# Patient Record
Sex: Female | Born: 1955 | Race: White | Hispanic: No
Health system: Midwestern US, Academic
[De-identification: ages and names within clinical notes are randomized; demographics above are authoritative.]

---

## 2017-01-09 ENCOUNTER — Encounter: Admit: 2017-01-09 | Discharge: 2017-01-09 | Payer: MEDICARE | Primary: Family

## 2017-01-09 ENCOUNTER — Ambulatory Visit: Admit: 2017-01-09 | Discharge: 2017-01-09 | Payer: MEDICARE | Primary: Family

## 2017-01-09 DIAGNOSIS — E079 Disorder of thyroid, unspecified: ICD-10-CM

## 2017-01-09 DIAGNOSIS — I10 Essential (primary) hypertension: ICD-10-CM

## 2017-01-09 DIAGNOSIS — R3129 Other microscopic hematuria: ICD-10-CM

## 2017-01-09 DIAGNOSIS — M1990 Unspecified osteoarthritis, unspecified site: ICD-10-CM

## 2017-01-09 DIAGNOSIS — E119 Type 2 diabetes mellitus without complications: ICD-10-CM

## 2017-01-09 DIAGNOSIS — F419 Anxiety disorder, unspecified: Principal | ICD-10-CM

## 2017-01-09 DIAGNOSIS — R339 Retention of urine, unspecified: Principal | ICD-10-CM

## 2017-01-09 DIAGNOSIS — N3941 Urge incontinence: ICD-10-CM

## 2017-01-09 NOTE — Assessment & Plan Note
-   will hold off on initiation of anticholinergic for UUI until completion of workup of microscopic hematuria

## 2017-01-09 NOTE — Progress Notes
Date of Service: 01/09/2017     Subjective:             Cynthia Stanley is a 61 y.o. female who presents for evaluation of urinary retention.    History of Present Illness  Cynthia Stanley is a 61 year old female with history significant for poorly controlled diabetes, HTN and longstanding smoking history (1ppd since the age of 61 years of age) who presents to clinic for evaluation of incomplete bladder emptying.    Patient had RBUS performed by her PCP which demonstrated PVR of and so she was referred to Urology for further evaluation. No history of recurrent urinary tract infections, bladder stones and no hydronephrosis noted on RBUS. She has not needed to be catheterized    Currently, patient endorses some mild urge urinary incontinence as the most bothersome complaint. She does also have some stress urinary incontinence. Neither are terribly bothersome to the patient and she is not needing to wear any pads for leakage. She does endorse positional voiding and needing to valsalva void. She denies bulge or need for manual reduction in order to urinate. She denies constipation or dyspareunia.    Patient does have poorly controlled diabetes as mentioned above, but she is does not recall her most recent HgbA1c. She does though state that her sugars are consistently >300 and often are too high to register a reading on her glucometer.    Patient denies any episodes of gross hematuria but has been told in the past she has had microscopic hematuria. She does have a long history of smoking as stated above. No personal of family history of urologic malignancy.    Labs and Studies:  PVR: 14 mls  U/A:  Blood: positive  Leukocytes: negative  Nitrite: negative  Glucose: positive          Past Medical History:   Diagnosis Date   ??? Anxiety disorder    ??? Arthritis    ??? DM (diabetes mellitus) (HCC)    ??? Hypertension    ??? Thyroid disorder      Past Surgical History:   Procedure Laterality Date   ??? HX TUBAL LIGATION No family history on file.  Current Outpatient Prescriptions   Medication Sig Dispense Refill   ??? insulin aspart U-100 (NOVOLOG FLEXPEN U-100 INSULIN) 100 unit/mL injection PEN Inject  under the skin.     ??? insulin detemir(+) (LEVEMIR) 100 unit/mL Inject  under the skin.     ??? insulin glargine (LANTUS) 100 unit/mL injection Inject  under the skin.     ??? metFORMIN (GLUCOPHAGE) 1,000 mg tablet Take  by mouth.       No current facility-administered medications for this visit.      No Known Allergies  Social History     Social History   ??? Marital status: Divorced     Spouse name: N/A   ??? Number of children: N/A   ??? Years of education: N/A     Occupational History   ??? Not on file.     Social History Main Topics   ??? Smoking status: Current Every Day Smoker     Types: Cigarettes   ??? Smokeless tobacco: Never Used   ??? Alcohol use No   ??? Drug use: No   ??? Sexual activity: No     Other Topics Concern   ??? Not on file     Social History Narrative   ??? No narrative on file  Review of Systems   Constitutional: Negative for activity change, appetite change, chills, diaphoresis, fatigue, fever and unexpected weight change.   HENT: Negative for congestion, hearing loss, rhinorrhea, sinus pressure, sore throat and trouble swallowing.    Eyes: Negative for photophobia and visual disturbance.   Respiratory: Negative for apnea, cough, chest tightness, shortness of breath and wheezing.    Cardiovascular: Negative for chest pain, palpitations and leg swelling.   Gastrointestinal: Negative for abdominal distention, abdominal pain, blood in stool, constipation, diarrhea, nausea, rectal pain and vomiting.   Genitourinary: Positive for difficulty urinating, frequency and urgency. Negative for decreased urine volume, dyspareunia, dysuria, enuresis, flank pain, genital sores, hematuria, vaginal bleeding, vaginal discharge and vaginal pain.   Musculoskeletal: Negative for arthralgias, back pain, gait problem, joint swelling and neck pain. Skin: Negative for rash and wound.   Neurological: Negative for dizziness, tremors, seizures, syncope, weakness, light-headedness, numbness and headaches.   Hematological: Negative for adenopathy. Does not bruise/bleed easily.   Psychiatric/Behavioral: Negative for decreased concentration and dysphoric mood. The patient is not nervous/anxious.          Objective:         ??? insulin aspart U-100 (NOVOLOG FLEXPEN U-100 INSULIN) 100 unit/mL injection PEN Inject  under the skin.   ??? insulin detemir(+) (LEVEMIR) 100 unit/mL Inject  under the skin.   ??? insulin glargine (LANTUS) 100 unit/mL injection Inject  under the skin.   ??? metFORMIN (GLUCOPHAGE) 1,000 mg tablet Take  by mouth.     Vitals:    01/09/17 0856 01/09/17 0857   BP: (!) 149/101 (!) 160/106   Pulse: 88 79   Weight: 99.3 kg (219 lb)    Height: 170.2 cm (67)      Body mass index is 34.3 kg/m???.     Physical Exam   Constitutional: She is oriented to person, place, and time. She appears well-developed and well-nourished. No distress.   HENT:   Head: Normocephalic and atraumatic.   Right Ear: External ear normal.   Left Ear: External ear normal.   Eyes: Pupils are equal, round, and reactive to light. Conjunctivae and EOM are normal.   Neck: Normal range of motion. Neck supple. No tracheal deviation present. No thyromegaly present.   Cardiovascular: Normal rate, regular rhythm, normal heart sounds and intact distal pulses.    Pulmonary/Chest: Effort normal and breath sounds normal. No respiratory distress. She has no wheezes.   Abdominal: Soft. Bowel sounds are normal. She exhibits no distension. There is no tenderness.   Musculoskeletal: Normal range of motion. She exhibits no edema or tenderness.   Neurological: She is alert and oriented to person, place, and time.   Skin: Skin is warm and dry. She is not diaphoretic. No erythema.   Psychiatric: She has a normal mood and affect. Her behavior is normal. Judgment and thought content normal. Assessment and Plan:    Problem   Microscopic Hematuria    01/09/2017 - blood in urine on dipstick analysis; will send for microscopic urinalysis; long history of smoking     Urinary Incontinence, Urge    01/09/2017 - initial evaluation by Dr. Penelope Coop; PVR = 16mL; UA with + protein and + glucose; consistent with long history of diabetes         Microscopic hematuria  - BMP today  - CT urogram and cystoscopy first available to workup microscopic hematuria    Urinary incontinence, urge  - will hold off on initiation of anticholinergic for UUI until completion of workup  of microscopic hematuria    Patient seen and discussed with Dr. Penelope Coop, who directed plan of care.    Margot Chimes, MD  PGY-3 Urology Resident    Orders Placed This Encounter   ??? CT ABD/PELV WO/W CONTRAST   ??? URINALYSIS, MICROSCOPIC   ??? BASIC METABOLIC PANEL   ??? POC URINE DIPSTICK MANUAL READ     ATTESTATION    I personally saw and evaluated the patient and determined the plan of care.??? I personally performed the key portions of the E&M visit, discussed the case with the resident, and concur with documentation of history, physical examination, assessment, and treatment plan unless otherwise noted.          Liborio Nixon, MD    01/09/2017 12:46 PM

## 2017-01-09 NOTE — Assessment & Plan Note
-   BMP today  - CT urogram and cystoscopy first available to workup microscopic hematuria

## 2017-05-01 ENCOUNTER — Encounter: Admit: 2017-05-01 | Discharge: 2017-05-01 | Payer: MEDICARE | Primary: Family

## 2017-06-26 ENCOUNTER — Encounter: Admit: 2017-06-26 | Discharge: 2017-06-26 | Payer: MEDICARE | Primary: Family

## 2017-06-26 ENCOUNTER — Ambulatory Visit: Admit: 2017-06-26 | Discharge: 2017-06-26 | Payer: MEDICARE | Primary: Family

## 2017-06-26 DIAGNOSIS — I10 Essential (primary) hypertension: ICD-10-CM

## 2017-06-26 DIAGNOSIS — R339 Retention of urine, unspecified: Principal | ICD-10-CM

## 2017-06-26 DIAGNOSIS — R3129 Other microscopic hematuria: Principal | ICD-10-CM

## 2017-06-26 DIAGNOSIS — F419 Anxiety disorder, unspecified: Principal | ICD-10-CM

## 2017-06-26 DIAGNOSIS — E119 Type 2 diabetes mellitus without complications: ICD-10-CM

## 2017-06-26 DIAGNOSIS — M1990 Unspecified osteoarthritis, unspecified site: ICD-10-CM

## 2017-06-26 DIAGNOSIS — E079 Disorder of thyroid, unspecified: ICD-10-CM

## 2017-06-26 LAB — POC CREATININE, RAD: Lab: 2.1 mg/dL — ABNORMAL HIGH (ref 0.4–1.00)

## 2017-06-26 MED ORDER — CIPROFLOXACIN HCL 500 MG PO TAB
500 mg | ORAL_TABLET | Freq: Two times a day (BID) | ORAL | 0 refills | 10.00000 days | Status: AC
Start: 2017-06-26 — End: ?

## 2018-10-06 ENCOUNTER — Encounter: Admit: 2018-10-06 | Discharge: 2018-10-07 | Payer: MEDICARE | Primary: Family

## 2018-10-06 ENCOUNTER — Encounter: Admit: 2018-10-06 | Discharge: 2018-10-06 | Payer: MEDICARE | Primary: Family

## 2018-10-06 DIAGNOSIS — R69 Illness, unspecified: Secondary | ICD-10-CM

## 2018-10-28 ENCOUNTER — Encounter: Admit: 2018-10-28 | Discharge: 2018-10-28 | Primary: Family

## 2018-11-03 ENCOUNTER — Encounter: Admit: 2018-11-03 | Discharge: 2018-11-03 | Payer: MEDICARE | Primary: Family

## 2018-11-10 ENCOUNTER — Encounter: Admit: 2018-11-10 | Discharge: 2018-11-10 | Payer: MEDICARE | Primary: Family

## 2018-11-10 NOTE — Telephone Encounter
New Referral Intake  TO PATIENT:  Thank you for contacting us, how did  you hear about our transplant program? Neph    Do you have a support person who will be with you before, during, and after a transplant?Yes   Name and Relationship of Support Person: Daughter    Is English your primary language? Yes   If no, do you or your support person need an interpreter?     Primary Insurance Company: Medicare  Policy #: [phone]  Group ID:     Secondary Insurance Company: [place] [employer]  Policy #: [id number]  Group ID:     Would you accept a lifesaving blood transfusion? Yes  Have you had a hospitalization in the past 6 months?  Stomach issues [place] and [place]  Who is your kidney doctor?  Dr. Farris Has  Who is your primary doctor?  Darden Dates      Past Medical History:  (if answer is yes, please specify)    How tall are you?  5 7  How much do you weigh? Between 200 and 300   Calculated BMI:  39.2 (used 250 as a guess  What is the cause of your kidney disease?  Bad eating and other than that she doesn't know  Are you diabetic?  Type 2     at what age were you diagnosed (Endocrinologist)? Unsure when sees a doctor at [facility]   Are you currently on dialysis?  No    Have you been denied or listed by another transplant center? No  Are you currently working with any other transplant centers? No  Have you had a previous organ transplant? No    History of:  Heart disease?  Has something but she doesn't know  Do you have a heart doctor? They come to [facility] once a month  History of heart stents?  No getting ready to do a ultrasound on her heart   Are you on medication to keep stents open (ex. Coumadin, Plavix, Brilinta)? Something that started out in shots and now is in pill she thinks   Are you on medication to help raise your blood pressure (Midodrine)? No   Lung disease? Unsure we would have to talk to the doctors   Are you on oxygen (How many liters)? 4L   Do you wear CPAP (breathing machine at night to sleep)? No Do you use tobacco or nicotine  products or have you in the past? Previously smoked   Packs/cans per day? 2-3 for years? Since age 63  When did you quit? 2-3 weeks ago   Do you use any illegal substances or have you in the past? No  TO PATIENT: If you currently smoke, you need to quit to be considered a transplant candidate.  Liver disease?  No  History of HIV or Hepatitis? No  Cancer? No  Any active infections? No    open wounds or sores? No   Psychiatric illness (Depression/Anxiety/Bipolar Disorder)?  No    Do you see a psychiatrist or counselor?  No  Do you use a cane/walker/ other assistive devices to get around? Uses a walker in the house and scooter for far distances    Past Surgical History:    Have you had any abdominal surgeries? No  Have you had any surgeries on blood vessels? No  Have you had any amputations?  No    Demographic and Social History:    What gender were you assigned at birth?  Female  What gender  do you identify with currently? Female  What race do you identify with? a person  Patient declined to answer    Do you have an interest living donors? No   If so, they are welcome to come to evaluation with you.    We may need you to sign a Release of Information Authorization Form. Do you have access to a fax machine or an e-mail address we can send it to? No     TO PATIENT: You will need to bring your support person with you to the transplant evaluation.  This is required to be considered a candidate for transplant.  If you have interested living donors, they are welcome to come with you, as well.       Note to staff -   Obtain the following records:  ? nephrology note  ? hospital discharge summary within 6 month (or most recent)   ? pathology report    Notify Dr. Curt Bears if delay in scheduling due to lack of records     If patient has possible living donor - have them call:  ? [place] - [phone]  ? Lelon Mast [phone]

## 2018-11-11 ENCOUNTER — Encounter: Admit: 2018-11-11 | Discharge: 2018-11-11 | Payer: MEDICARE | Primary: Family

## 2018-11-11 NOTE — Telephone Encounter
Reviewed records sent with referral. Many concerns. Attempted to call patient to clarify. Left messages.

## 2018-11-12 ENCOUNTER — Encounter: Admit: 2018-11-12 | Discharge: 2018-11-12 | Payer: MEDICARE | Primary: Family

## 2018-11-12 NOTE — Progress Notes
Reviewed additional records from [facility]. Significantly more evidence she is not a good candidate. Will d/w Dr Abram Sander.

## 2018-11-14 ENCOUNTER — Encounter: Admit: 2018-11-14 | Discharge: 2018-11-14 | Payer: MEDICARE | Primary: Family

## 2018-11-24 NOTE — Telephone Encounter
Referral Review with: Dr Abram Sander  Coordinator reviewed all available testing/documentation   Determination: DENIED d/t Chronic use of supplemental Oxygen (4L); + Stress test 10/07/18- need cath (recommended)    CT added to Committee agenda to assess vasculature (?r/o permanently).

## 2018-11-27 NOTE — Committee Review
PRE Transplant Discussion Note     Organ being evaluated for: Kidney    Transplant Coordinator: Evorn Gong  Transplant Surgeon:       Transplant Physician:     Committee Review Members:  Dietician, Registered [place], Gearldine Shown   Nephrology Diane Kem Kays, MD, Michaelle Birks, MD, Fabio Neighbors, MD, Wonda Cheng, MD, Meredith Pel, APRN   Pharmacist Dorothea Glassman, [place]   Psychiatry Edsel Petrin, MD   Social Worker, Clinical Lynetta Mare, Mollie Germany   Transplant Services Burtis Junes, RN, Sueanne Margarita, RN, Alwyn Pea, RN, Livingston Diones, RN, Orvis Brill, RN, Verneda Skill, RN, Evorn Gong, RN, Thurston Pounds, RN   Transplant Surgery Leilani Able, MD, Almon Register, MD, Elmer Sow, MD       Reason for Discussion: CT review    Discussion Summary: CT reviewed by surgeons: vasculature is ok. Patient may a surgical candidate. Recommend yearly scans.

## 2019-02-02 ENCOUNTER — Encounter: Admit: 2019-02-02 | Discharge: 2019-02-02 | Payer: MEDICARE | Primary: Family

## 2019-09-21 ENCOUNTER — Encounter: Admit: 2019-09-21 | Discharge: 2019-09-21 | Payer: MEDICARE | Primary: Family

## 2019-09-21 ENCOUNTER — Inpatient Hospital Stay: Payer: MEDICARE

## 2019-09-22 ENCOUNTER — Inpatient Hospital Stay: Admit: 2019-09-22 | Discharge: 2019-09-22 | Payer: MEDICARE | Primary: Family

## 2019-09-22 ENCOUNTER — Encounter: Admit: 2019-09-22 | Discharge: 2019-09-22 | Payer: MEDICARE | Primary: Family

## 2019-09-22 DIAGNOSIS — J449 Chronic obstructive pulmonary disease, unspecified: Secondary | ICD-10-CM

## 2019-09-22 DIAGNOSIS — I10 Essential (primary) hypertension: Secondary | ICD-10-CM

## 2019-09-22 DIAGNOSIS — E119 Type 2 diabetes mellitus without complications: Secondary | ICD-10-CM

## 2019-09-22 DIAGNOSIS — N186 End stage renal disease: Secondary | ICD-10-CM

## 2019-09-22 DIAGNOSIS — M1990 Unspecified osteoarthritis, unspecified site: Secondary | ICD-10-CM

## 2019-09-22 DIAGNOSIS — I509 Heart failure, unspecified: Secondary | ICD-10-CM

## 2019-09-22 MED ORDER — MELATONIN 3 MG PO TAB
3 mg | Freq: Every evening | ORAL | 0 refills | Status: DC | PRN
Start: 2019-09-22 — End: 2019-09-27

## 2019-09-22 MED ORDER — FLUTICASONE PROPION-SALMETEROL 250-50 MCG/DOSE IN DSDV
1 | Freq: Two times a day (BID) | RESPIRATORY_TRACT | 0 refills | Status: DC
Start: 2019-09-22 — End: 2019-09-27

## 2019-09-22 MED ORDER — PIPERACILLIN/TAZOBACTAM 2.25 G/100ML NS IVPB (MB+)
2.25 g | INTRAVENOUS | 0 refills | Status: DC
Start: 2019-09-22 — End: 2019-09-23
  Administered 2019-09-23 (×4): 2.25 g via INTRAVENOUS

## 2019-09-22 MED ORDER — TIOTROPIUM BROMIDE 18 MCG IN CPDV
1 | Freq: Every day | RESPIRATORY_TRACT | 0 refills | Status: DC
Start: 2019-09-22 — End: 2019-09-27

## 2019-09-22 MED ORDER — SODIUM CHLORIDE 0.9 % IV SOLP
300 mL | INTRAVENOUS | 0 refills | Status: CP | PRN
Start: 2019-09-22 — End: ?

## 2019-09-22 MED ORDER — FUROSEMIDE 10 MG/ML IJ SOLN
60 mg | Freq: Once | INTRAVENOUS | 0 refills | Status: CP
Start: 2019-09-22 — End: ?

## 2019-09-22 MED ORDER — CLOPIDOGREL 75 MG PO TAB
75 mg | Freq: Every day | ORAL | 0 refills | Status: DC
Start: 2019-09-22 — End: 2019-09-27
  Administered 2019-09-24 – 2019-09-26 (×3): 75 mg via ORAL

## 2019-09-22 MED ORDER — VANCOMYCIN RANDOM DOSING
1 | INTRAVENOUS | 0 refills | Status: DC
Start: 2019-09-22 — End: 2019-09-22

## 2019-09-22 MED ORDER — ALBUTEROL SULFATE 90 MCG/ACTUATION IN HFAA
2 | RESPIRATORY_TRACT | 0 refills | Status: DC | PRN
Start: 2019-09-22 — End: 2019-09-27

## 2019-09-22 MED ORDER — ASPIRIN 81 MG PO TBEC
81 mg | Freq: Every day | ORAL | 0 refills | Status: DC
Start: 2019-09-22 — End: 2019-09-27
  Administered 2019-09-24 – 2019-09-26 (×3): 81 mg via ORAL

## 2019-09-22 MED ORDER — PANTOPRAZOLE 40 MG PO TBEC
40 mg | Freq: Every day | ORAL | 0 refills | Status: DC
Start: 2019-09-22 — End: 2019-09-27
  Administered 2019-09-23 – 2019-09-26 (×4): 40 mg via ORAL

## 2019-09-22 MED ORDER — REMDESIVIR 100MG IVPB (MB+)
100 mg | INTRAVENOUS | 0 refills | Status: CP
Start: 2019-09-22 — End: ?
  Administered 2019-09-23 – 2019-09-26 (×7): 100 mg via INTRAVENOUS

## 2019-09-22 MED ORDER — SODIUM CHLORIDE 0.9 % IV SOLP
2000 mL | INTRAVENOUS | 0 refills | Status: DC | PRN
Start: 2019-09-22 — End: 2019-09-22

## 2019-09-22 MED ORDER — VANCOMYCIN 1,500 MG IVPB
15 mg/kg | Freq: Once | INTRAVENOUS | 0 refills | Status: CP
Start: 2019-09-22 — End: ?

## 2019-09-22 MED ORDER — NICOTINE 21 MG/24 HR TD PT24
1 | Freq: Every day | TRANSDERMAL | 0 refills | Status: DC
Start: 2019-09-22 — End: 2019-09-27

## 2019-09-22 MED ORDER — INSULIN ASPART 100 UNIT/ML SC FLEXPEN
0-12 [IU] | Freq: Before meals | SUBCUTANEOUS | 0 refills | Status: DC
Start: 2019-09-22 — End: 2019-09-25

## 2019-09-22 MED ORDER — VANCOMYCIN PHARMACY TO MANAGE
1 | 0 refills | Status: DC
Start: 2019-09-22 — End: 2019-09-22

## 2019-09-22 MED ORDER — INSULIN ASPART 100 UNIT/ML SC FLEXPEN
0-6 [IU] | Freq: Before meals | SUBCUTANEOUS | 0 refills | Status: DC
Start: 2019-09-22 — End: 2019-09-22

## 2019-09-22 MED ORDER — INSULIN GLARGINE 100 UNIT/ML (3 ML) SC INJ PEN
10 [IU] | Freq: Every evening | SUBCUTANEOUS | 0 refills | Status: DC
Start: 2019-09-22 — End: 2019-09-23

## 2019-09-22 MED ORDER — DEXAMETHASONE 4 MG PO TAB
6 mg | Freq: Every day | ORAL | 0 refills | Status: DC
Start: 2019-09-22 — End: 2019-09-27
  Administered 2019-09-23 – 2019-09-26 (×4): 6 mg via ORAL

## 2019-09-22 MED ORDER — IPRATROPIUM-ALBUTEROL 20-100 MCG/ACTUATION IN MIST
1 | RESPIRATORY_TRACT | 0 refills | Status: DC | PRN
Start: 2019-09-22 — End: 2019-09-22

## 2019-09-22 MED ORDER — ENOXAPARIN 100 MG/ML SC SYRG
1 mg/kg | SUBCUTANEOUS | 0 refills | Status: DC
Start: 2019-09-22 — End: 2019-09-27
  Administered 2019-09-23 – 2019-09-25 (×3): 100 mg via SUBCUTANEOUS

## 2019-09-22 MED ORDER — FUROSEMIDE 10 MG/ML IJ SOLN
40 mg | Freq: Once | INTRAVENOUS | 0 refills | Status: DC
Start: 2019-09-22 — End: 2019-09-22

## 2019-09-22 MED ORDER — ACETAMINOPHEN 500 MG PO TAB
500 mg | ORAL | 0 refills | Status: DC | PRN
Start: 2019-09-22 — End: 2019-09-27
  Administered 2019-09-24: 23:00:00 500 mg via ORAL

## 2019-09-22 MED ORDER — REMDESIVIR IVPB (LOADING DOSE)
200 mg | INTRAVENOUS | 0 refills | Status: CP
Start: 2019-09-22 — End: ?

## 2019-09-22 MED ORDER — SODIUM CHLORIDE 0.9 % IV SOLP
200 mL | INTRAVENOUS | 0 refills | Status: DC | PRN
Start: 2019-09-22 — End: 2019-09-22

## 2019-09-22 MED ORDER — INSULIN ASPART 100 UNIT/ML SC FLEXPEN
0-12 [IU] | Freq: Before meals | SUBCUTANEOUS | 0 refills | Status: DC
Start: 2019-09-22 — End: 2019-09-22

## 2019-09-22 MED ORDER — NICOTINE (POLACRILEX) 2 MG BU GUM
2 mg | BUCCAL | 0 refills | Status: DC | PRN
Start: 2019-09-22 — End: 2019-09-27

## 2019-09-22 MED ORDER — INSULIN REGULAR IN 0.9 % NACL 100 UNIT/100 ML (1 UNIT/ML) IV SOLN
1-32 [IU]/h | INTRAVENOUS | 0 refills | Status: DC
Start: 2019-09-22 — End: 2019-09-22

## 2019-09-22 MED ORDER — METOPROLOL TARTRATE 25 MG PO TAB
25 mg | Freq: Two times a day (BID) | ORAL | 0 refills | Status: DC
Start: 2019-09-22 — End: 2019-09-24
  Administered 2019-09-23: 14:00:00 25 mg via ORAL

## 2019-09-22 MED ORDER — SIMVASTATIN 20 MG PO TAB
20 mg | Freq: Every evening | ORAL | 0 refills | Status: DC
Start: 2019-09-22 — End: 2019-09-27
  Administered 2019-09-24 – 2019-09-26 (×3): 20 mg via ORAL

## 2019-09-22 MED ORDER — HEPARIN, PORCINE (PF) 5,000 UNIT/0.5 ML IJ SYRG
5000 [IU] | SUBCUTANEOUS | 0 refills | Status: DC
Start: 2019-09-22 — End: 2019-09-22

## 2019-09-22 NOTE — Progress Notes
SOA x1 week  Renal disease, CHF   Dialysis in [place] scheduled for tomorrow.   [place] isn't accepting because they are full.   Do not have inpatient dialysis at sending.     Progress SOA X1 week, no COVID exposure, not vaccinated but is COVID +    Looks good but glucose 800 by lab  15 u IV insulin started gg @ 9 units per hour  WBC 16  Pulmonary edema with possible opacities on CXR  40mg  IV Lasix  K+ 6.6  60 of Kayexalate     180/70  NRS 90's  Sp02 93-95% on room air  Respiratory rate 22  Afebrile   GCS 15    Venous ph 7.41    Incredibly noncompliant

## 2019-09-22 NOTE — Progress Notes
WBC-16.3  Hgb-10.4  HCT-34.9  Plt-135  Na+131  K+6.6  Cl-100  CO2-17  Gap-21  BUN-65  Creat-4.9  Glu-806--> 16u--> 506--> gtt  Trop-0. 24 (normal for her)  EKG=Unchanged --> SR;   Alb-2.8  Lactate-1.5  COVID (+)  CXR-CHF

## 2019-09-23 MED ORDER — METOPROLOL TARTRATE 25 MG PO TAB
25 mg | Freq: Two times a day (BID) | ORAL | 0 refills | Status: DC
Start: 2019-09-23 — End: 2019-09-27
  Administered 2019-09-24 – 2019-09-26 (×5): 25 mg via ORAL

## 2019-09-23 MED ORDER — ATORVASTATIN 40 MG PO TAB
40 mg | Freq: Every day | ORAL | 0 refills | Status: DC
Start: 2019-09-23 — End: 2019-09-23

## 2019-09-23 MED ORDER — INSULIN ASPART 100 UNIT/ML SC FLEXPEN
5 [IU] | Freq: Three times a day (TID) | SUBCUTANEOUS | 0 refills | Status: DC
Start: 2019-09-23 — End: 2019-09-27

## 2019-09-23 MED ORDER — INSULIN GLARGINE 100 UNIT/ML (3 ML) SC INJ PEN
20 [IU] | Freq: Every evening | SUBCUTANEOUS | 0 refills | Status: DC
Start: 2019-09-23 — End: 2019-09-25

## 2019-09-24 ENCOUNTER — Inpatient Hospital Stay: Admit: 2019-09-24 | Discharge: 2019-09-24 | Payer: MEDICARE | Primary: Family

## 2019-09-24 MED ORDER — LABETALOL 5 MG/ML IV SYRG
10 mg | INTRAVENOUS | 0 refills | Status: DC | PRN
Start: 2019-09-24 — End: 2019-09-27

## 2019-09-24 MED ORDER — SODIUM CHLORIDE 0.9 % IV SOLP
300 mL | INTRAVENOUS | 0 refills | Status: CP | PRN
Start: 2019-09-24 — End: ?
  Administered 2019-09-24: 16:00:00 300 mL via INTRAVENOUS

## 2019-09-24 MED ORDER — SODIUM CHLORIDE 0.9 % IV SOLP
200 mL | INTRAVENOUS | 0 refills | Status: DC | PRN
Start: 2019-09-24 — End: 2019-09-24

## 2019-09-24 MED ORDER — EPOETIN ALFA-EPBX 10,000 UNIT/ML IJ SOLN
10000 [IU] | SUBCUTANEOUS | 0 refills | Status: DC
Start: 2019-09-24 — End: 2019-09-27
  Administered 2019-09-24 – 2019-09-26 (×2): 10000 [IU] via SUBCUTANEOUS

## 2019-09-24 MED ORDER — SODIUM CHLORIDE 0.9 % IV SOLP
2000 mL | INTRAVENOUS | 0 refills | Status: DC | PRN
Start: 2019-09-24 — End: 2019-09-24

## 2019-09-24 MED ORDER — ISOSORBIDE MONONITRATE 60 MG PO TB24
60 mg | Freq: Every day | ORAL | 0 refills | Status: DC
Start: 2019-09-24 — End: 2019-09-27
  Administered 2019-09-24 – 2019-09-26 (×3): 60 mg via ORAL

## 2019-09-24 MED ORDER — NIFEDIPINE 60 MG PO TR24
60 mg | Freq: Every day | ORAL | 0 refills | Status: DC
Start: 2019-09-24 — End: 2019-09-27
  Administered 2019-09-24 – 2019-09-26 (×3): 60 mg via ORAL

## 2019-09-25 MED ORDER — INSULIN ASPART 100 UNIT/ML SC FLEXPEN
0-6 [IU] | Freq: Before meals | SUBCUTANEOUS | 0 refills | Status: DC
Start: 2019-09-25 — End: 2019-09-27

## 2019-09-25 MED ORDER — INSULIN GLARGINE 100 UNIT/ML (3 ML) SC INJ PEN
15 [IU] | Freq: Every evening | SUBCUTANEOUS | 0 refills | Status: DC
Start: 2019-09-25 — End: 2019-09-27

## 2019-09-26 ENCOUNTER — Encounter: Admit: 2019-09-26 | Discharge: 2019-09-26 | Payer: MEDICARE | Primary: Family

## 2019-09-26 MED ORDER — SODIUM CHLORIDE 0.9 % IV SOLP
300 mL | INTRAVENOUS | 0 refills | Status: CP | PRN
Start: 2019-09-26 — End: ?
  Administered 2019-09-26: 14:00:00 300 mL via INTRAVENOUS

## 2019-09-26 MED ORDER — NICOTINE 21 MG/24 HR TD PT24
1 | MEDICATED_PATCH | Freq: Every day | TRANSDERMAL | 0 refills | Status: CN
Start: 2019-09-26 — End: ?

## 2019-09-26 MED ORDER — SODIUM CHLORIDE 0.9 % IV SOLP
2000 mL | INTRAVENOUS | 0 refills | Status: DC | PRN
Start: 2019-09-26 — End: 2019-09-27

## 2019-09-26 MED ORDER — SODIUM CHLORIDE 0.9 % IV SOLP
200 mL | INTRAVENOUS | 0 refills | Status: DC | PRN
Start: 2019-09-26 — End: 2019-09-27

## 2019-09-26 MED ORDER — ACETAMINOPHEN 500 MG PO TAB
500 mg | ORAL | 0 refills | Status: AC | PRN
Start: 2019-09-26 — End: ?

## 2019-09-26 MED ORDER — NICOTINE (POLACRILEX) 2 MG BU GUM
2 mg | ORAL | 0 refills | Status: CN | PRN
Start: 2019-09-26 — End: ?

## 2019-09-26 MED FILL — NICOTINE 21 MG/24 HR TD PT24: 21 mg/day | TRANSDERMAL | 28 days supply | Qty: 28 | Status: CN

## 2019-09-28 ENCOUNTER — Encounter: Admit: 2019-09-28 | Discharge: 2019-09-28 | Payer: MEDICARE | Primary: Family

## 2019-09-28 NOTE — Telephone Encounter
Spoke with family regarding post discharge follow up.    Patient Perception: spoke with patient's daughter who states patient is doing well at home. Patient threw up in bag on the way home so verified information on the discharge paperwork.     Admitting diagnosis: patient admitted with COVID.     Home Services / Help at home: patient is cared for by daughter, doing well with no concerns.     Lifestyle changes: N/A    New medications / Medication Calendar: patients daughter verbalized understanding of medications verbalized understanding.     Follow up appointments: pulmonary request for follow up appointment.     Follow up labs / Follow up imaging: N/A    Nutrition/Supplementation: N/A    Did you review if the patient was satisfied with their stay? Yes    Unit contact information provided to family to call with questions or concerns.

## 2019-09-28 NOTE — Telephone Encounter
LVM for pt to call back to schedule for HFU.  Shearon Stalls, RN

## 2019-09-28 NOTE — Telephone Encounter
-----   Message from Irven Easterly, APRN-NP sent at 09/25/2019  5:31 PM CDT -----  Patient needs hospital follow-up with [facility]. She may have trouble doing telehealth. Admitted with COVID-19, hs of chronic hypoxic respiratory failure, chronic bronchitis, active smoker, ESRD on dialysis. Has PCP but does not see a pulmonologist.     Thanks,   Boneta Lucks

## 2019-09-29 ENCOUNTER — Encounter: Admit: 2019-09-29 | Discharge: 2019-09-29 | Payer: MEDICARE | Primary: Family

## 2019-10-08 ENCOUNTER — Encounter: Admit: 2019-10-08 | Discharge: 2019-10-08 | Payer: MEDICARE | Primary: Family

## 2019-10-08 ENCOUNTER — Inpatient Hospital Stay: Admit: 2019-10-08 | Discharge: 2019-10-08 | Payer: MEDICARE | Primary: Family

## 2019-10-08 ENCOUNTER — Inpatient Hospital Stay: Admit: 2019-10-08 | Payer: MEDICARE

## 2019-10-08 DIAGNOSIS — N186 End stage renal disease: Secondary | ICD-10-CM

## 2019-10-08 DIAGNOSIS — I10 Essential (primary) hypertension: Secondary | ICD-10-CM

## 2019-10-08 DIAGNOSIS — J449 Chronic obstructive pulmonary disease, unspecified: Secondary | ICD-10-CM

## 2019-10-08 DIAGNOSIS — J9601 Acute respiratory failure with hypoxia: Secondary | ICD-10-CM

## 2019-10-08 DIAGNOSIS — E119 Type 2 diabetes mellitus without complications: Secondary | ICD-10-CM

## 2019-10-08 DIAGNOSIS — M1990 Unspecified osteoarthritis, unspecified site: Secondary | ICD-10-CM

## 2019-10-08 DIAGNOSIS — I509 Heart failure, unspecified: Secondary | ICD-10-CM

## 2019-10-08 LAB — COMPREHENSIVE METABOLIC PANEL
Lab: 143 MMOL/L — ABNORMAL LOW (ref 137–147)
Lab: 3.2 g/dL — ABNORMAL LOW (ref 3.5–5.0)
Lab: 8 U/L (ref 7–40)

## 2019-10-08 LAB — URINALYSIS DIPSTICK REFLEX TO CULTURE
Lab: NEGATIVE
Lab: NEGATIVE
Lab: NEGATIVE
Lab: NEGATIVE
Lab: NEGATIVE
Lab: NEGATIVE

## 2019-10-08 LAB — PHOSPHORUS: Lab: 4.3 mg/dL — ABNORMAL LOW (ref 2.0–4.5)

## 2019-10-08 LAB — COVID-19 (SARS-COV-2) PCR

## 2019-10-08 LAB — TSH WITH FREE T4 REFLEX: Lab: 6.1 uU/mL — ABNORMAL HIGH (ref 0.35–5.00)

## 2019-10-08 LAB — LACTIC ACID (BG - RAPID LACTATE)
Lab: 0.9 MMOL/L — ABNORMAL HIGH (ref 0.5–2.0)
Lab: 0.9 MMOL/L — ABNORMAL LOW (ref 0.5–2.0)
Lab: 0.6 MMOL/L (ref 0.5–2.0)

## 2019-10-08 LAB — BLOOD GASES, ARTERIAL: Lab: 7.4 MMOL/L — ABNORMAL HIGH (ref 7.35–7.45)

## 2019-10-08 LAB — POC GLUCOSE
Lab: 167 mg/dL — ABNORMAL HIGH (ref 70–100)
Lab: 282 mg/dL — ABNORMAL HIGH (ref 70–100)
Lab: 248 mg/dL — ABNORMAL HIGH (ref 70–100)

## 2019-10-08 LAB — MRSA PNEUMONIA SCREEN

## 2019-10-08 LAB — IRON + BINDING CAPACITY + %SAT+ FERRITIN
Lab: 21 ug/dL — ABNORMAL LOW (ref 50–160)
Lab: 259 ug/dL — ABNORMAL LOW (ref 270–380)
Lab: 321 ng/mL — ABNORMAL HIGH (ref 10–200)
Lab: 8 % — ABNORMAL LOW (ref 28–42)

## 2019-10-08 LAB — CBC AND DIFF: Lab: 8.8 10*3/uL (ref 4.5–11.0)

## 2019-10-08 LAB — PROTIME INR (PT): Lab: 1 M/UL — ABNORMAL LOW (ref 0.8–1.2)

## 2019-10-08 LAB — RVP VIRAL PANEL PCR

## 2019-10-08 LAB — TROPONIN-I
Lab: 0.3 ng/mL — ABNORMAL HIGH (ref 0.0–0.05)
Lab: 0.5 ng/mL — ABNORMAL HIGH (ref 0.0–0.05)
Lab: 0.4 ng/mL — ABNORMAL HIGH (ref 0.0–0.05)

## 2019-10-08 LAB — STREPTOCOCCUS PNEUMO AG, URINE

## 2019-10-08 LAB — PROCALCITONIN: Lab: 0.4 ng/mL

## 2019-10-08 LAB — URINALYSIS MICROSCOPIC REFLEX TO CULTURE

## 2019-10-08 LAB — LEGIONELLA ANTIGEN URINE,RAN

## 2019-10-08 LAB — BNP (B-TYPE NATRIURETIC PEPTI): Lab: 162 pg/mL — ABNORMAL HIGH (ref 0–100)

## 2019-10-08 LAB — FREE T4-FREE THYROXINE: Lab: 0.8 ng/dL (ref 0.6–1.6)

## 2019-10-08 LAB — IONIZED CALCIUM: Lab: 1.1 MMOL/L — ABNORMAL HIGH (ref 1.0–1.3)

## 2019-10-08 LAB — MAGNESIUM: Lab: 1.8 mg/dL — ABNORMAL HIGH (ref 1.6–2.6)

## 2019-10-08 MED ORDER — PERFLUTREN LIPID MICROSPHERES 1.1 MG/ML IV SUSP
1-20 mL | Freq: Once | INTRAVENOUS | 0 refills | Status: CP | PRN
Start: 2019-10-08 — End: ?
  Administered 2019-10-08: 16:00:00 2 mL via INTRAVENOUS

## 2019-10-08 MED ORDER — SODIUM CHLORIDE 0.9 % IV SOLP
300 mL | INTRAVENOUS | 0 refills | Status: CP | PRN
Start: 2019-10-08 — End: ?
  Administered 2019-10-08: 18:00:00 300 mL via INTRAVENOUS

## 2019-10-08 MED ORDER — PENTOXIFYLLINE 400 MG PO TBER
400 mg | Freq: Two times a day (BID) | ORAL | 0 refills | Status: AC
Start: 2019-10-08 — End: ?
  Administered 2019-10-08 – 2019-10-14 (×13): 400 mg via ORAL

## 2019-10-08 MED ORDER — METHYLPREDNISOLONE SOD SUC(PF) 40 MG/ML IJ SOLR
40 mg | INTRAVENOUS | 0 refills | Status: DC
Start: 2019-10-08 — End: 2019-10-08
  Administered 2019-10-08: 10:00:00 40 mg via INTRAVENOUS

## 2019-10-08 MED ORDER — EPOETIN ALFA-EPBX 10,000 UNIT/ML IJ SOLN
10000 [IU] | SUBCUTANEOUS | 0 refills | Status: AC
Start: 2019-10-08 — End: ?
  Administered 2019-10-10 – 2019-10-13 (×2): 10000 [IU] via SUBCUTANEOUS

## 2019-10-08 MED ORDER — VANCOMYCIN IVPB
15 mg/kg | Freq: Once | INTRAVENOUS | 0 refills | Status: DC
Start: 2019-10-08 — End: 2019-10-08

## 2019-10-08 MED ORDER — INSULIN GLARGINE 100 UNIT/ML (3 ML) SC INJ PEN
45 [IU] | Freq: Every evening | SUBCUTANEOUS | 0 refills | Status: AC
Start: 2019-10-08 — End: ?
  Administered 2019-10-09: 03:00:00 45 [IU] via SUBCUTANEOUS

## 2019-10-08 MED ORDER — SIMVASTATIN 20 MG PO TAB
20 mg | Freq: Every evening | ORAL | 0 refills | Status: AC
Start: 2019-10-08 — End: ?
  Administered 2019-10-09 – 2019-10-14 (×6): 20 mg via ORAL

## 2019-10-08 MED ORDER — NIFEDIPINE 10 MG PO CAP
60 mg | Freq: Every day | ORAL | 0 refills | Status: DC
Start: 2019-10-08 — End: 2019-10-08
  Administered 2019-10-08: 13:00:00 60 mg via ORAL

## 2019-10-08 MED ORDER — FUROSEMIDE 10 MG/ML IJ SOLN
80 mg | Freq: Once | INTRAVENOUS | 0 refills | Status: CP
Start: 2019-10-08 — End: ?
  Administered 2019-10-08: 12:00:00 80 mg via INTRAVENOUS

## 2019-10-08 MED ORDER — ASPIRIN 81 MG PO CHEW
81 mg | Freq: Every day | ORAL | 0 refills | Status: AC
Start: 2019-10-08 — End: ?
  Administered 2019-10-08 – 2019-10-14 (×7): 81 mg via ORAL

## 2019-10-08 MED ORDER — CALCIUM ACETATE(PHOSPHAT BIND) 667 MG PO CAP
667 mg | Freq: Three times a day (TID) | ORAL | 0 refills | Status: AC
Start: 2019-10-08 — End: ?
  Administered 2019-10-08 – 2019-10-14 (×18): 667 mg via ORAL

## 2019-10-08 MED ORDER — TIOTROPIUM BROMIDE 2.5 MCG/ACTUATION IN MIST
1 | Freq: Every day | RESPIRATORY_TRACT | 0 refills | Status: AC
Start: 2019-10-08 — End: ?
  Administered 2019-10-08 – 2019-10-10 (×3): 1 via RESPIRATORY_TRACT

## 2019-10-08 MED ORDER — VANCOMYCIN PHARMACY TO MANAGE
1 | 0 refills | Status: DC
Start: 2019-10-08 — End: 2019-10-08

## 2019-10-08 MED ORDER — CARVEDILOL 6.25 MG PO TAB
6.25 mg | Freq: Two times a day (BID) | ORAL | 0 refills | Status: AC
Start: 2019-10-08 — End: ?
  Administered 2019-10-09 – 2019-10-10 (×3): 6.25 mg via ORAL

## 2019-10-08 MED ORDER — TIOTROPIUM BROMIDE 2.5 MCG/ACTUATION IN MIST
1 | Freq: Every day | RESPIRATORY_TRACT | 0 refills | Status: DC
Start: 2019-10-08 — End: 2019-10-08

## 2019-10-08 MED ORDER — ONDANSETRON HCL (PF) 4 MG/2 ML IJ SOLN
4 mg | INTRAVENOUS | 0 refills | Status: AC | PRN
Start: 2019-10-08 — End: ?

## 2019-10-08 MED ORDER — METHYLPREDNISOLONE SOD SUC(PF) 40 MG/ML IJ SOLR
40 mg | Freq: Every day | INTRAVENOUS | 0 refills | Status: DC
Start: 2019-10-08 — End: 2019-10-08

## 2019-10-08 MED ORDER — ISOSORBIDE MONONITRATE 30 MG PO TB24
30 mg | Freq: Every day | ORAL | 0 refills | Status: AC
Start: 2019-10-08 — End: ?
  Administered 2019-10-08 – 2019-10-14 (×7): 30 mg via ORAL

## 2019-10-08 MED ORDER — IPRATROPIUM-ALBUTEROL 0.5 MG-3 MG(2.5 MG BASE)/3 ML IN NEBU
3 mL | RESPIRATORY_TRACT | 0 refills | Status: DC | PRN
Start: 2019-10-08 — End: 2019-10-08

## 2019-10-08 MED ORDER — VANCOMYCIN RANDOM DOSING
1 | INTRAVENOUS | 0 refills | Status: DC
Start: 2019-10-08 — End: 2019-10-08

## 2019-10-08 MED ORDER — PANTOPRAZOLE 40 MG PO TBEC
40 mg | Freq: Every day | ORAL | 0 refills | Status: AC
Start: 2019-10-08 — End: ?
  Administered 2019-10-09 – 2019-10-14 (×6): 40 mg via ORAL

## 2019-10-08 MED ORDER — INSULIN ASPART 100 UNIT/ML SC FLEXPEN
0-12 [IU] | Freq: Every day | SUBCUTANEOUS | 0 refills | Status: DC
Start: 2019-10-08 — End: 2019-10-08
  Administered 2019-10-08: 17:00:00 4 [IU] via SUBCUTANEOUS

## 2019-10-08 MED ORDER — NIFEDIPINE 30 MG PO TR24
60 mg | Freq: Every day | ORAL | 0 refills | Status: AC
Start: 2019-10-08 — End: ?
  Administered 2019-10-09 – 2019-10-11 (×3): 60 mg via ORAL

## 2019-10-08 MED ORDER — TORSEMIDE 20 MG PO TAB
40 mg | ORAL | 0 refills | Status: AC
Start: 2019-10-08 — End: ?
  Administered 2019-10-09 – 2019-10-14 (×3): 40 mg via ORAL

## 2019-10-08 MED ORDER — METHYLPREDNISOLONE SOD SUC(PF) 40 MG/ML IJ SOLR
40 mg | Freq: Two times a day (BID) | INTRAVENOUS | 0 refills | Status: DC
Start: 2019-10-08 — End: 2019-10-08

## 2019-10-08 MED ORDER — DOXYCYCLINE 100ML IVPB
100 mg | Freq: Two times a day (BID) | INTRAVENOUS | 0 refills | Status: DC
Start: 2019-10-08 — End: 2019-10-08
  Administered 2019-10-08 (×2): 100 mg via INTRAVENOUS

## 2019-10-08 MED ORDER — ALBUTEROL SULFATE 2.5 MG /3 ML (0.083 %) IN NEBU
2.5 mg | RESPIRATORY_TRACT | 0 refills | Status: AC | PRN
Start: 2019-10-08 — End: ?

## 2019-10-08 MED ORDER — IPRATROPIUM-ALBUTEROL 0.5 MG-3 MG(2.5 MG BASE)/3 ML IN NEBU
3 mL | RESPIRATORY_TRACT | 0 refills | Status: DC
Start: 2019-10-08 — End: 2019-10-08
  Administered 2019-10-08: 15:00:00 3 mL via RESPIRATORY_TRACT

## 2019-10-08 MED ORDER — LABETALOL 5 MG/ML IV SYRG
10 mg | INTRAVENOUS | 0 refills | Status: DC | PRN
Start: 2019-10-08 — End: 2019-10-08
  Administered 2019-10-08: 12:00:00 10 mg via INTRAVENOUS

## 2019-10-08 MED ORDER — SODIUM CHLORIDE 0.9 % IV SOLP
2000 mL | INTRAVENOUS | 0 refills | Status: AC | PRN
Start: 2019-10-08 — End: ?

## 2019-10-08 MED ORDER — CLOPIDOGREL 75 MG PO TAB
75 mg | Freq: Every day | ORAL | 0 refills | Status: AC
Start: 2019-10-08 — End: ?
  Administered 2019-10-08 – 2019-10-14 (×7): 75 mg via ORAL

## 2019-10-08 MED ORDER — FLUTICASONE PROPION-SALMETEROL 250-50 MCG/DOSE IN DSDV
1 | Freq: Two times a day (BID) | RESPIRATORY_TRACT | 0 refills | Status: AC
Start: 2019-10-08 — End: ?
  Administered 2019-10-08: 15:00:00 1 via RESPIRATORY_TRACT

## 2019-10-08 MED ORDER — VANCOMYCIN 2,000 MG IVPB
20 mg/kg | Freq: Once | INTRAVENOUS | 0 refills | Status: CP
Start: 2019-10-08 — End: ?
  Administered 2019-10-08 (×2): 2000 mg via INTRAVENOUS

## 2019-10-08 MED ORDER — METOPROLOL TARTRATE 25 MG PO TAB
25 mg | Freq: Two times a day (BID) | ORAL | 0 refills | Status: DC
Start: 2019-10-08 — End: 2019-10-08
  Administered 2019-10-08: 13:00:00 25 mg via ORAL

## 2019-10-08 MED ORDER — HEPARIN, PORCINE (PF) 5,000 UNIT/0.5 ML IJ SYRG
5000 [IU] | SUBCUTANEOUS | 0 refills | Status: AC
Start: 2019-10-08 — End: ?
  Administered 2019-10-08 – 2019-10-14 (×17): 5000 [IU] via SUBCUTANEOUS

## 2019-10-08 MED ORDER — NICOTINE 14 MG/24 HR TD PT24
1 | Freq: Every day | TRANSDERMAL | 0 refills | Status: AC
Start: 2019-10-08 — End: ?

## 2019-10-08 MED ORDER — SODIUM CHLORIDE 0.9 % IV SOLP
200 mL | INTRAVENOUS | 0 refills | Status: AC | PRN
Start: 2019-10-08 — End: ?

## 2019-10-08 MED ORDER — INSULIN ASPART 100 UNIT/ML SC FLEXPEN
0-24 [IU] | Freq: Before meals | SUBCUTANEOUS | 0 refills | Status: AC
Start: 2019-10-08 — End: ?

## 2019-10-08 MED ORDER — PIPERACILLIN/TAZOBACTAM 4.5 G/100ML NS IVPB (MB+)
4.5 g | Freq: Two times a day (BID) | INTRAVENOUS | 0 refills | Status: DC
Start: 2019-10-08 — End: 2019-10-08
  Administered 2019-10-08 (×2): 4.5 g via INTRAVENOUS

## 2019-10-08 NOTE — Case Management (ED)
Case Management Admission Assessment    NAME:Cynthia Stanley                          MRN: [id number]             DOB:[birth year]          AGE: 64 y.o.  ADMISSION DATE: 10/08/2019             DAYS ADMITTED: LOS: 0 days      Today?s Date: 10/08/2019    Source of Information: EMR and previous admission assessment.    Per medical record, 64 y/o female admitted from [place] for Acute on Chronic Hypoxic Respiratory Failure requiring NIPPV. 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       Plan  Plan: Case Management Assessment, Assist PRN with SW/NCM Services   ? NCM reviewed EMR.  ? NCM attended and participated in MICU4 huddle.  ? NCM attempted to complete initial NCM assessment with patient X2; however, patient not available on both attempts.  ? NCM gathered assessment information from previous admission earlier this month.  ? Anticipate patient to return home upon discharge with daughter providing transportation.  ? Patient is current with [facility] prior to admission. Will need signed ROC orders faxed upon discharge.  ? Patient receives outpatient HD at [facility] on TTh[facility] at 1630.  ? NCM will continue to follow for discharge planning/needs.    Patient Address/Phone  [address] [address]  [phone] (home)     Emergency Contact  Extended Emergency Contact Information  Primary Emergency Contact: Linus Orn [place]  Mobile Phone: [phone]  Relation: Daughter    Systems developer  Does the Patient Need Case Management to Arrange Discharge Transport? (ex: facility, ambulance, wheelchair/stretcher, Medicaid, cab, other): No  Will the Patient Use Family Transport?: Yes  Transportation Name, Phone and Availability #1: Patient's daughter Shanda Bumps [phone] will provide transportation upon discharge.    Expected Discharge Date       Living Situation Prior to Admission  ? Living Arrangements  Type of Residence: Home, independent  Living Arrangements: Other (Comment) (Lives with a roommate.)  Financial risk analyst / Tub: Tub/Shower Unit  How many levels in the residence?: 1  Can patient live on one level if needed?: N/A  Does residence have entry and/or side stairs?: No (Ramp access with 1 step into mobile home.)  Assistance needed prior to admit or anticipated on discharge: No  ? Level of Function   Prior level of function: Independent  ? Cognitive Abilities   Cognitive Abilities: Unable to Assess    Financial Resources  ? Coverage  Primary Insurance: Medicare  Secondary Insurance: Medicaid  Additional Coverage: RX    ? Source of Income   Source Of Income: SSDI  ? Financial Assistance Needed?  Denies financial assistance needs at this time.    Psychosocial Needs  ? Mental Health  Mental Health History: No  ? Substance Use History  Substance Use History Screen: Yes  Comment: Current tobacco use.  ? Other  None.    Current/Previous Services  ? PCP  Nathaneil Canary, [phone], [phone]  ? Pharmacy    [facility] - [place], [place] - [address]  [address]  [place] [address]  Phone: [phone] Fax: [phone]    ? Durable Psychologist, educational at home: [employer], Single [employer], Art gallery manager, Oxygen  ? Home Health  Receiving home health: Yes  Agency name: [facility]  Would patient use this agency again?: Yes  ? Hemodialysis or Peritoneal Dialysis  Undergoing hemodialysis or peritoneal dialysis: Yes  Hemodialysis or Peritoneal Dialysis: Hemodialysis  Location: [facility]  Days attending: Tuesday, Thursday, Saturday  Dialysis Start Time: 1630  Does patient sit for treatment?: Yes  Transportation to treatment via: MTM  ? Tube/Enteral Feeds  Receive tube/enteral feeds: No  ? Infusion  Receive infusions: No  ? Private Duty  Private duty help used: No  ? Home and Community Based Services  Home and community based services: No  ? [employer]: N/A  ? Hospice  Hospice: No  ? Outpatient Therapy  PT: No  OT: No  SLP: No  ? Skilled Nursing Facility/Nursing Home  SNF: No  NH: No  ? Inpatient Rehab  IPR: No  ? Long-Term Acute Care Hospital  LTACH: No  ? Acute Hospital Stay  Acute Hospital Stay: In the past  Was patient's stay within the last 30 days?: Yes  Name of Hospital: [facility]  When did patient receive care?: 09/22/19-09/26/19  Readmission Code Group: 7. Advancement of Disease / Chronic Illness Management  7. Advancement of Disease / Chronic Illness Management: 7a3. Respiratory infection: Pneumonia/Bronchitis/other    Lilli Few, BSN, RN  Med Private M Nurse Case Manager  [phone] and available on [place]

## 2019-10-08 NOTE — Progress Notes
COPD education postponed today due to NIPPV, Chest Pain, and Dialysis for patient today.  Will continue to follow and provide education when patient can participate.  COPD folder placed in room.

## 2019-10-08 NOTE — Progress Notes
Critical Care Progress Note       Name:  Cynthia Stanley                                             MRN:  [id number]   Admission Date:  10/08/2019    Active Problems:    COVID-19 virus infection    ESRD (end stage renal disease) (HCC)    Acute on chronic respiratory failure with hypoxia (HCC)    Type 2 diabetes mellitus, with long-term current use of insulin (HCC)    Acute on chronic diastolic heart failure (HCC)    Chronic obstructive pulmonary disease with acute lower respiratory infection (HCC)    Tobacco use    COPD exacerbation [facility])                       Brief Hospital Course     Cynthia Stanley is a 64 y.o. female admitted from [place] for Acute on Chronic Hypoxic Respiratory Failure requiring NIPPV. 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 Admitted to MICU on NIPPV. CXR with pulmonary edema, BNP elevated. Suspect etiology flash pulmonary edema from HTN emergency vs HD/diuretic non-compliance vs decompensated HFpEF. Attempt diuresis and consult HD for volume removal needs. Liberated from Bipap this AM. Will DC abx with low s/f infection.    Assessment and Plan     NEURO:   - No acute issues; a/o x4    CV:   CAD   HFpEF  PVD  HTN/HLD  Hypertensive Emergency (resolved)  - PTA: clopidogrel 75mg  QD, simvastatin 20mg  qHS, ASA 81mg  QD, pentoxifylline 40mg  QD, torsemide 40mg  QD, isosorbide mononitrate 30mg  QD    - PTA: coreg 6.25 BID, nifedipine 60mg  QD   - SBP in 200's on Arrival at OSH, Trop 0.0.17  - ED Tx: Labetalol 10mg     - BNP on admit 1,620  - Echo 8/19: LV normal size w/ moderate concentric hypertrophy and normal systolic function (EF ~60%). Left ventricle diastolic dysfunction. RV w/ normal size and function. Markedly elevated CVP (10-20 mmHg).   - SBP 150-180s, SR 70s; SBP 110s after AM meds  Elevated trop   - Trop trend 0.017 (OSH) >> 0.39 >> 0.52   - Suspect cardiac demand   - Patient symptomatic   - EKG w/o ischemic changes  Plan  - Continue PTA clopidogrel, ASA, isosorbide, coreg, nifedipine, pentoxifylline ER, simvastatin   - Trend Trop  - Lasix 80 now and resume Torsemide for non-HD days  - HD as below     PULM:   Acute on Chronic Hypoxic Respiratory Failure  COPD Hx of SARS-CoV-2 w recent DC from [place] on 09/22/19  Current Every Day Smoker   - Suspect infection vs flash pulmonary edema with HTN emergency vs non-compliance to HD or PTA meds vs decompensated HFpEF  - PTA: Albuterol HFA, Advair b.i.d, DNs, Ellipta 1puff D  - Pre-hospital SpO2: 75% -> started on NIPPV in ED   - Baseline Oxygen Requirement: 3-4L NC   - CXR on admit [place]: generalized cardiomegaly with recurrence of pulmonary vascular congestion and progression of interstitial prominence consistent with interstitial edema. Generally coarse mixed opacities bilaterally which may reflect  residual changes following Covid 19 pneumonia.   - Right pleural effusion noted on 8/19 echo  - BLE dopper 8/19: negative for DVT  - ABG (on Bipap 12/5, PS 7 40%): 7.46 / 37 / 165 / 26.2  - Currently on 6L NC  PLAN  - Wean NIPPV as able  - Stop COPD Exacerbation protocol   - Diuresis as above, HD as below for volume management   - ID as below   - Continue PTA Advair & Spiriva + Dns PRN    GI:   - Liver enzyme WNL   GERD  - PTA Metoclopramide 10mg  q6H PRN, Pantoprazole 40mg  QD  PLAN  - Renal diet  - Continue PTA PPI  - Add Ondansetron PRN since QTc < 500    RENAL:   ESRD on HD (T,TH,S)   - Dialysis at [facility] in [place]. Reportedly no missed runs, although CXR with pulmonary edema  - OSH BUN 28, Cr 4.26  - U/o since admit:  PLAN  - Nephrology consulted - plan for D today aiming for 3-4L UF       - Epo TIW       - Continue phos-binders       - Resume PTA Torsemide on non-HD days        - Dietary and fluid restrictions ordered   - Diuresis as above    ENDO:   DM Type II   - PTA Aspart 20 units q AC, Levemir 45 units q HS  - POC Glucose: 167  PLAN   - Continue PTA Lantus + HDCF w/ meals    ID:   - 8/19 WBC 8.8;  afebrile  - Imaging as above  - BCx: pending  - UA 8/19: uninfected   - Strep pneumo, legionella 8/19: neg  - MRSA pna screen 8/19: neg  - RVP 8/19: neg  - Procal 8/19: 0.4  Recent SARS-CoV-2 PNA  - SARS CoV-2 Dx 8/2 / Self reports test was false positive   - S/p 5D Remdesivir and steroids - not continued at DC  - With rapid improvement in oxygen needs, decompensation thought to be largely r/t volume removal  PLAN  - DC abx with negative infectious w/u     HEME:   - 8/19 Hgb 8.1, Plt 228      Prophylaxis Review:  Lines: Peripheral Line  Tubes: None  Diet: Yes  Insulin: Yes  Urinary Catheter: No  VTE ppx: SQH; SCDs  GI ppx: PPI  PT/OT: Yes    Code status: Full Code  Disposition: Likely stable to TTF later today       Starlyn Skeans, APRN   Pulm/Critical Care  Pager [phone]  Available on Voalte and Cureatr.     Pt seen and discussed w/ Dr. Silvestre Moment.       M4 team pager (2nd call/nights) [phone]  ___________________________________________________________________________  Subjective:     Cynthia Stanley is a 64 y.o. female who is resting in bed this morning on NC.     ROS:   Denies: SOB, cough, N/V, C/D and rash.  Positive: chest pain, neck pain    Objective:   Medications:  Scheduled Meds:aspirin chewable tablet 81 mg, 81 mg, Oral, QDAY  clopiDOGrel (PLAVIX) tablet 75 mg, 75 mg, Oral, QDAY  doxycycline (VIBRAMYCIN) 100 mg in dextrose 5% (D5W) 100 mL IVPB, 100 mg, Intravenous, BID  heparin (porcine) PF syringe 5,000 Units, 5,000 Units, Subcutaneous, Q8H  insulin aspart (U-100) (NOVOLOG FLEXPEN  U-100 INSULIN) injection PEN 0-12 Units, 0-12 Units, Subcutaneous, 5 X Daily  isosorbide mononitrate ER (IMDUR) tablet 30 mg, 30 mg, Oral, QDAY  methylPREDNISolone (SOLU-Medrol) injection 40 mg, 40 mg, Intravenous, Q6H   Followed by  Melene Muller ON 10/11/2019] methylPREDNISolone (SOLU-Medrol) injection 40 mg, 40 mg, Intravenous, Q12H   Followed by  Melene Muller ON 10/14/2019] methylPREDNISolone (SOLU-Medrol) injection 40 mg, 40 mg, Intravenous, QDAY  metoprolol tartrate tablet 25 mg, 25 mg, Oral, BID  nicotine (NICODERM CQ STEP 2) 14 mg/day patch 1 patch, 1 patch, Transdermal, QDAY  NIFEdipine (PROCARDIA) capsule 60 mg, 60 mg, Oral, QDAY  pantoprazole DR (PROTONIX) tablet 40 mg, 40 mg, Oral, QDAY(21)  piperacillin/tazobactam (ZOSYN) 4.5 g in sodium chloride 0.9% (NS) 100 mL IVPB (MB+), 4.5 g, Intravenous, Q12H  vancomycin (VANCOCIN) 2,000 mg in sodium chloride 0.9% (NS) 290 mL IVPB, 20 mg/kg, Intravenous, ONCE  vancomycin, random dosing, 1 each, Intravenous, Random Dosing    Continuous Infusions:  PRN and Respiratory Meds:albuterol-ipratropium Q4H PRN, labetalol (NORMODYNE; TRANDATE) injection Q4H PRN, vancomycin, pharmacy to manage Per Pharmacy                       Vital Signs: Last Filed                  Vital Signs: 24 Hour Range   BP: 158/86 (08/19 0504)  Temp: 36.3 ?C (97.4 ?F) (08/19 1610)  Pulse: 73 (08/19 0538)  Respirations: 18 PER MINUTE (08/19 0538)  SpO2: 100 % (08/19 0538)  SpO2 Pulse: 74 (08/19 0504)  Height: 170.2 cm (67) (08/19 0504) BP: (158)/(86)   Temp:  [36.3 ?C (97.4 ?F)]   Pulse:  [73-78]   Respirations:  [16 PER MINUTE-18 PER MINUTE]   SpO2:  [100 %]      Vitals:    10/08/19 0504   Weight: 94.3 kg (207 lb 14.3 oz)           Intake/Output Summary:  (Last 24 hours)    Intake/Output Summary (Last 24 hours) at 10/08/2019 9604  Last data filed at 10/08/2019 0540  Gross per 24 hour   Intake ?   Output 200 ml   Net -200 ml         Physical Exam:      General:  Alert, cooperative, no acute respiratory distress  Head:  Normocephalic, without obvious abnormality, atraumatic  Eyes:  Conjunctivae/corneas clear.  PERRL, EOMs intact.   Throat: MMM  Neck:   Supple, symmetrical, trachea midline  Lungs:  Clear to auscultation bilaterally, w/o wheeze. Non-exertional breathing pattern   Heart:   RRR, S1, S2 normal, no murmur appreciated  Abdomen:  Soft, non-tender.  Bowel sounds present.    Extremities: Normal, atraumatic, no cyanosis. +1 BLE edema   Peripheral pulses:  2+ and symmetric, all extremities  Skin: Color, texture, turgor normal.  No rashes or lesions to exposed skin  Neurologic:  A/o x 4, moves all extremities, no focal deficit.        Laboratory:  LABS:  No results for input(s): NA, K, CL, CO2, GAP, BUN, CR, GLU, CA, ALBUMIN, MG, PO4, HGBA1C, TSH, CORT in the last 72 hours.    No results for input(s): WBC, HGB, HCT, PLTCT, PT, INR, PTT, AST, ALT, ALKPHOS, CKMB, MYOGLB, TNI in the last 72 hours.   Estimated Creatinine Clearance: 16.4 mL/min (A) (based on SCr of 4.19 mg/dL (H)).  Vitals:    10/08/19 0504   Weight: 94.3 kg (207 lb 14.3  oz)      Recent Labs     10/08/19  0516   PHART 7.46*   PO2ART 165*           Radiology and Other Diagnostic Procedures Review:    Reviewed

## 2019-10-08 NOTE — Progress Notes
All belongings gathered and placed in belonging bag with patient labels at bedside.  The bag(s) contain(s) the following:    Clothing: shirt, pants, underwear   Shoes: none  Jewelry: none  Identification/Driver's License: No  Cash: none  Credit Cards: none  Electronics: none  Dentures/Glasses/Hearing aids: none  Assistive devices: none  Other: oxygen tubing    All belongings placed in 2 bag(s).    Belongings disposition: bags placed in cabinet with patient at bedside.

## 2019-10-08 NOTE — Consults
Renal Consult    Cynthia Stanley  Admission Date:  10/08/2019         Assessment and Plan     Active Problems:    COVID-19 virus infection    ESRD (end stage renal disease) (HCC)    Acute on chronic respiratory failure with hypoxia (HCC)    Type 2 diabetes mellitus, with long-term current use of insulin (HCC)    Acute on chronic diastolic heart failure (HCC)    Chronic obstructive pulmonary disease with acute lower respiratory infection (HCC)    Tobacco use    COPD exacerbation (HCC)        ADIEL MCNAMARA is a 64 y.o. female    ESRD  -outpatient location: [facility]  -schedule: TTS, last HD on tuesday  -current acces: left AVG, denies any issues with accessing the access.  -EDW: 200 lbs, admission weight 207lbs  -usual HD run: 3.5 hours  -does not know about maximum tolearable UF  -still makes urine, on torsemide 40mg  on non HD days    Microcytic Anemia  -hb not at goal for ESRD  -would start epo with HD days    CKD-MBD  -takes calcium acetate for phos binding    HTN  -on Nifedipine, isosorbide mono 60mg , metoprolol 25mg  BID, coreg 6.25mg  BID.  -her medication regimen needs to be adjusted before discharge. She is on 2 BB as per lsited Home meds, 2 statins as well.    Acute on chronic hypercarbic resp failure  -on home oxygen  -recent COVID infection.    Diastolic HF  DM-II    Recommendations:  -HD today in accordance to out patient sch. She is 7lbs above her EDW, aim 3-4L UF as she is able to tolerate.  -next HD on Saturday.  -epo 10K units, TIW. Check iron panel (ordered), may benefit from IV iron  -adjust PTA anti-hypertensive, discontinue metoprolol and increase coreg if needed.  -resume torsemide on non HD days.  -Continue PTA renal vitamins  -continue PTA phos binders- phoslo with meals  -renally dose all meds  -limit water intake to 1.5L/day  -limit 2gm sodium diet  -follow renal diet      Dianna Rossetti, MD  Nephrology fellow  Pager [phone]  Available on Voalte/AMS        History     Reason for Consult: ESRD on dialysis    HPI: Cynthia Stanley is a 64 y.o. female with past medical history of ESRD on dialysis for last year, hypertension, type 2 diabetes, chronic diastolic heart failure, COPD on 2 L home oxygen, peripheral vascular disease, hard of hearing was accepted as a transfer from [place] for increased oxygen requirements, hypoxia, and acute on chronic hypercapnic respiratory failure.  She was directly admitted to ICU, required noninvasive positive pressure ventilation.  I saw her bedside this morning.  She is on 6 L of oxygen, with SPO2 anywhere from 88-95%.  She is very hard of hearing, answers most of the questions which I cannot understand.  Feels like she is more short of breath than her baseline.  She does not endorse any inciting factor, she has not missed any dialysis treatment.  She has been in dialysis for 1 year, still continue to make urine.  She goes to Tuesday Thursday Saturdays, last dialysis was on Tuesday.  Denies any issues during that dialysis.    She denies having any nausea, vomiting, diarrhea, chest pain during my encounter.    Past Medical History  Medical History:   Diagnosis Date   ? Arthritis    ? Congestive heart failure (CHF) (HCC)    ? COPD (chronic obstructive pulmonary disease) (HCC)    ? DM (diabetes mellitus) (HCC)    ? ESRD (end stage renal disease) (HCC)    ? Hypertension        Surgical History:   Procedure Laterality Date   ? HX CESAREAN SECTION     ? HX TUBAL LIGATION         Family History  Family history reviewed; non-contributory    Social History   Social History     Socioeconomic History   ? Marital status: Divorced     Spouse name: Not on file   ? Number of children: Not on file   ? Years of education: Not on file   ? Highest education level: Not on file   Occupational History   ? Not on file   Tobacco Use   ? Smoking status: Current Every Day Smoker     Packs/day: 1.00     Types: Cigarettes   ? Smokeless tobacco: Never Used   Substance and Sexual Activity   ? Alcohol use: No   ? Drug use: No   ? Sexual activity: Never     Partners: Male   Other Topics Concern   ? Not on file   Social History Narrative   ? Not on file          Medications  MEDSalbuterol-ipratropium, 3 mL, Inhalation, Q4H While Awake  aspirin, 81 mg, Oral, QDAY  calcium acetate(phosphat bind), 667 mg, Oral, TID w/ meals  clopiDOGrel, 75 mg, Oral, QDAY  doxycycline  (VIBRAMYCIN) IVPB, 100 mg, Intravenous, BID  fluticasone-salmeterol, 1 puff, Inhalation, BID  heparin (porcine), 5,000 Units, Subcutaneous, Q8H  insulin aspart (U-100), 0-12 Units, Subcutaneous, 5 X Daily  isosorbide mononitrate ER, 30 mg, Oral, QDAY  methylPREDNISolone (SOLU-MEDROL) IV, 40 mg, Intravenous, Q6H   Followed by  Melene Muller ON 10/11/2019] methylPREDNISolone (SOLU-MEDROL) IV, 40 mg, Intravenous, Q12H   Followed by  Melene Muller ON 10/14/2019] methylPREDNISolone (SOLU-MEDROL) IV, 40 mg, Intravenous, QDAY  metoprolol tartrate, 25 mg, Oral, BID  nicotine, 1 patch, Transdermal, QDAY  NIFEdipine, 60 mg, Oral, QDAY  pantoprazole DR, 40 mg, Oral, QDAY(21)  pentoxifylline ER, 400 mg, Oral, BID  piperacillin/tazobactam  (ZOSYN ) IVPB, 4.5 g, Intravenous, Q12H  simvastatin, 20 mg, Oral, QHS  vancomycin, random dosing, 1 each, Intravenous, Random Dosing     IV MEDS  Prn labetalol (NORMODYNE; TRANDATE) injection Q4H PRN 10 mg at 10/08/19 0704, vancomycin, pharmacy to manage Per Pharmacy     HOME MEDS  Prior to Admission Medications   Prescriptions Last Dose Informant Patient Reported? Taking?   NIFEdipine XL (PROCARDIA-XL) 60 mg tablet  Outside Pharmacy Yes No   Sig: Take 60 mg by mouth daily.   acetaminophen (TYLENOL EXTRA STRENGTH) 500 mg tablet   No No   Sig: Take one tablet by mouth every 4 hours as needed. Max of 4,000 mg of acetaminophen in 24 hours.   aspirin EC 81 mg tablet  Outside Pharmacy Yes No   Sig: Take 81 mg by mouth daily.   calcium acetate(phosphat bind) (PHOSLO) 667 mg tablet  Outside Pharmacy Yes No   Sig: TAKE ONE TABLET BY MOUTH THREE TIMES DAILY WITH MEALS   carvediloL (COREG) 6.25 mg tablet  Outside Pharmacy Yes No   Sig: Take 6.25 mg by mouth twice daily with meals.   clopiDOGrel (PLAVIX)  75 mg tablet  Outside Pharmacy Yes No   Sig: Take 75 mg by mouth daily.   dicyclomine (BENTYL) 20 mg tablet  Outside Pharmacy Yes No   Sig: Take 20 mg by mouth twice daily.   doxycycline hyclate (VIBRAMYCIN) 100 mg capsule  Outside Pharmacy Yes No   Sig: TAKE ONE CAPSULE BY MOUTH TWICE DAILY FOR 5 DAYS   fluticasone-salmeterol (ADVAIR DISKUS) 250-50 mcg/dose inhalation disk  Outside Pharmacy Yes No   Sig: Inhale 1 puff by mouth into the lungs every 12 hours.   insulin aspart (U-100) (NOVOLOG FLEXPEN U-100 INSULIN) 100 unit/mL (3 mL) injection PEN  Outside Pharmacy Yes No   Sig: Inject 20 Units under the skin. Patient injects different than prescribed 5 units three times a day with meals   insulin detemir(+) (LEVEMIR) 100 unit/mL  Outside Pharmacy Yes No   Sig: Inject 45 Units under the skin at bedtime daily.   isosorbide mononitrate ER (IMDUR) 60 mg tablet  Outside Pharmacy Yes No   Sig: Take 60 mg by mouth twice daily.   metoprolol tartrate 25 mg tablet  Outside Pharmacy Yes No   Sig: TAKE ONE TABLET BY MOUTH TWICE DAILY WITH FOOD   nicotine (NICODERM CQ STEP 1) 21 mg/day patch   No No   Sig: Apply one patch to top of skin as directed daily. Rotate patch location.  Indications: stop smoking   nicotine polacrilex (NICORETTE) 2 mg gum   No No   Sig: Take one each by mouth every 1 hour as needed. Chew to soften and park in mouth between lip and gum. May use 1 piece per hour, not to exceed 24 per day, for 12 weeks. May be used for longer, if needed.   pantoprazole DR (PROTONIX) 40 mg tablet  Outside Pharmacy Yes No   Sig: Take 40 mg by mouth daily.   pentoxifylline ER (TRENTAL) 400 mg tablet  Outside Pharmacy Yes No   Sig: Take 400 mg by mouth twice daily. Take with food.   rosuvastatin (CRESTOR) 20 mg tablet Outside Pharmacy Yes No   Sig: Take 20 mg by mouth at bedtime daily.   simvastatin (ZOCOR) 20 mg tablet  Outside Pharmacy Yes No   Sig: Take 20 mg by mouth at bedtime daily.   torsemide (DEMADEX) 20 mg tablet  Outside Pharmacy Yes No   Sig: Take 40 mg by mouth three times weekly. Monday, Wednesday and Friday.   umeclidinium(+) (INCRUSE ELLIPTA) 62.5 mcg/actuation inhalation disk  Outside Pharmacy Yes No   Sig: Inhale 1 puff by mouth into the lungs daily.      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  Respiratory: Worsening baseline shortness of breath  Cardiovascular: negative  Gastrointestinal: negative  Genitourinary:negative  Integument/breast: negative  Hematologic/lymphatic: negative  Musculoskeletal:negative  Neurological: negative  Endocrine: negative        Physical Exam        Vital Signs: Last Filed In 24 Hours Vital Signs: 24 Hour Range   BP: 103/89 (08/19 0900)  Temp: 36.7 ?C (98 ?F) (08/19 0800)  Pulse: 75 (08/19 0900)  Respirations: 27 PER MINUTE (08/19 0900)  SpO2: 88 % (08/19 0900)  SpO2 Pulse: 75 (08/19 0900)  Height: 170.2 cm (67) (08/19 0504) BP: (103-189)/(57-89)   Temp:  [36.3 ?C (97.4 ?F)-36.7 ?C (98 ?F)]  Pulse:  [69-79]   Respirations:  [16 PER MINUTE-27 PER MINUTE]   SpO2:  [88 %-100 %]           Vitals:    10/08/19 0504   Weight: 94.3 kg (207 lb 14.3 oz)       Intake/Output Summary (Last 24 hours) at 10/08/2019 0912  Last data filed at 10/08/2019 0540  Gross per 24 hour   Intake ?   Output 200 ml   Net -200 ml        Gen: Alert and Oriented, No Acute Distress   HEENT: Sclera normal; MMM hard of hearing  CV: no JVD, S1 and S2 normal, no rubs, murmurs or gallops   Pulm: Prolonged expiration with scattered wheeze.  On 6 L oxygen with mild respiratory distress or increased work of breathing GI: BS+ x4, non-tender to palpation  Neuro: Grossly normal, moving all extremities, speech intact  Ext: no edema, clubbing or cyanosis   Skin: no rash   Hemodialysis access?left brachiocephalic graft    Labs     Recent Labs     10/08/19  0520   NA 143   K 4.8   CL 105   CO2 27   GAP 11   BUN 31*   CR 4.25*   GLU 175*   CA 8.2*   ALBUMIN 3.2*   MG 1.8   PO4 4.3   TSH 6.11*       Recent Labs     10/08/19  0520   WBC 8.8   HGB 8.1*   HCT 25.0*   PLTCT 228   INR 1.0   AST 8   ALT 6*   ALKPHOS 74   TNI 0.39*      Estimated Creatinine Clearance: 16.2 mL/min (A) (based on SCr of 4.25 mg/dL (H)).  Vitals:    10/08/19 0504   Weight: 94.3 kg (207 lb 14.3 oz)      Recent Labs     10/08/19  0516   PHART 7.46*   PO2ART 165*                           Radiology     Pertinent radiology reviewed.

## 2019-10-08 NOTE — Progress Notes
Patient meets criteria to d/c COVID isolation per policy COVID19, Care of a patient.  Team in agreement per RN caring for pt. Pt is rehospitalizd 8/19 related to acute on chronic respiratory failure and fluid overload. She was d/c'd from [facility] 8/7 when she was on acute care status with COVID infection.

## 2019-10-08 NOTE — Progress Notes
Critical Care Attending Progress Note    Cynthia Stanley  [id number] Date:  10/08/2019  Admission Date: 10/08/2019  LOS: 0 days      Active Problems:    COVID-19 virus infection    ESRD (end stage renal disease) (HCC)    Acute on chronic respiratory failure with hypoxia (HCC)    Type 2 diabetes mellitus, with long-term current use of insulin (HCC)    Acute on chronic diastolic heart failure (HCC)    Chronic obstructive pulmonary disease with acute lower respiratory infection (HCC)    Tobacco use    COPD exacerbation (HCC)      Patient is critically ill secondary to the above diagnoses, but primarily concerned for the following:    Acute hypoxemic respiratory failure which appears to be primary related to pulmonary edema, responded to BIPAP initially and has been able to transition to NC temporarily. No clear evidence of clinical PNA or COPD exacerbation so will d/c steroid and antibiotics. Renal aware of admission and planning to undergo HD with volume removal. Attempted IV diuresis as still has UOP, continue as tolerated.     Elevated troponin but likely demand, no ACS seen on ECG, chest pain noted but likely from pulmonary edema, will continue to trend troponin and ECG for signs of ischemia.     Sick euthyroid noted on labs, no indication for further diagnostics/treatment, will need ongoing PCP evaluation and f/u.     I spent 45 minutes providing critical care services independent of procedures including:  ? performing a physical examination  ? serially reviewing laboratory, telemetry,  hemodynamic, oximetry, and respiratory data  ? reviewing radiographic images  ? reviewing medications  ? managing fluids/electrolytes, antibiotics, ICU prophylaxis, and mechanical ventilation   ? developing the overall plan of care.      Eloy End, MD  Assistant Professor   Pulmonary Critical Care    -----------------------------------------------------------------------------    Medications:  Scheduled Meds:aspirin chewable tablet 81 mg, 81 mg, Oral, QDAY  calcium acetate(phosphat bind) (PHOSLO) capsule 667 mg, 667 mg, Oral, TID w/ meals  clopiDOGrel (PLAVIX) tablet 75 mg, 75 mg, Oral, QDAY  fluticasone-salmeterol (ADVAIR DISKUS) 250-50 mcg/dose inhalation disk 1 puff, 1 puff, Inhalation, BID  heparin (porcine) PF syringe 5,000 Units, 5,000 Units, Subcutaneous, Q8H  insulin aspart (U-100) (NOVOLOG FLEXPEN U-100 INSULIN) injection PEN 0-12 Units, 0-12 Units, Subcutaneous, 5 X Daily  isosorbide mononitrate ER (IMDUR) tablet 30 mg, 30 mg, Oral, QDAY  metoprolol tartrate tablet 25 mg, 25 mg, Oral, BID  nicotine (NICODERM CQ STEP 2) 14 mg/day patch 1 patch, 1 patch, Transdermal, QDAY  NIFEdipine (PROCARDIA) capsule 60 mg, 60 mg, Oral, QDAY  pantoprazole DR (PROTONIX) tablet 40 mg, 40 mg, Oral, QDAY(21)  pentoxifylline ER (TRENtal) tablet 400 mg, 400 mg, Oral, BID  simvastatin (ZOCOR) tablet 20 mg, 20 mg, Oral, QHS    Continuous Infusions:  PRN and Respiratory Meds:albuterol-ipratropium Q4H PRN, labetalol (NORMODYNE; TRANDATE) injection Q4H PRN, sodium chloride 0.9% (NS) IP Dialysis PRN, sodium chloride 0.9% (NS) IP Dialysis PRN, sodium chloride 0.9% (NS) IP Dialysis PRN                       Vital Signs: Last Filed                  Vital Signs: 24 Hour Range   BP: 105/54 (08/19 1100)  Temp: 36.7 ?C (98 ?F) (08/19 0800)  Pulse: 67 (08/19 1100)  Respirations: 21 PER MINUTE (08/19  1100)  SpO2: 92 % (08/19 1100)  SpO2 Pulse: 66 (08/19 1100)  Height: 170.2 cm (67) (08/19 0900) BP: (103-189)/(54-89)   Temp:  [36.3 ?C (97.4 ?F)-36.7 ?C (98 ?F)]   Pulse:  [67-79]   Respirations:  [16 PER MINUTE-27 PER MINUTE]   SpO2:  [88 %-100 %]      Vitals:    10/08/19 0504 10/08/19 0900   Weight: 94.3 kg (207 lb 14.3 oz) 94.3 kg (207 lb 14.3 oz)           Intake/Output Summary:  (Last 24 hours)    Intake/Output Summary (Last 24 hours) at 10/08/2019 1127  Last data filed at 10/08/2019 0540  Gross per 24 hour   Intake ?   Output 200 ml   Net -200 ml         Physical Exam: Physical Exam:  General Appearance: Alert, no apparent distress, hard of hearing   Skin: no rashes, no ecchymosis  HEENT: PERRL, MMM  Chest and Lungs: LCTA B, no respiratory distress  Cardiovascular: RRR  Abdomen: soft, nondistended, non tender to palpation, +BS, no rebound, guarding or rigidity  Genitourinary: Foley  Extremities: warm, no peripheral edema  Neurologic: Alert and oriented x 3    LABS:  Recent Labs     10/08/19  0520   NA 143   K 4.8   CL 105   CO2 27   GAP 11   BUN 31*   CR 4.25*   GLU 175*   CA 8.2*   ALBUMIN 3.2*   MG 1.8   PO4 4.3   TSH 6.11*       Recent Labs     10/08/19  0520 10/08/19  0815   WBC 8.8  --    HGB 8.1*  --    HCT 25.0*  --    PLTCT 228  --    INR 1.0  --    AST 8  --    ALT 6*  --    ALKPHOS 74  --    TNI 0.39* 0.52*      Estimated Creatinine Clearance: 16.2 mL/min (A) (based on SCr of 4.25 mg/dL (H)).  Vitals:    10/08/19 0504 10/08/19 0900   Weight: 94.3 kg (207 lb 14.3 oz) 94.3 kg (207 lb 14.3 oz)      Recent Labs     10/08/19  0516   PHART 7.46*   PO2ART 165*                 Radiology and Other Diagnostic Procedures Review:    Reviewed pertinent studies.

## 2019-10-08 NOTE — H&P (View-Only)
Critical Care   Admission History and Physical Assessment         Name:  Cynthia Stanley                                             MRN:  [id number]   Admission Date:  10/08/2019  Active Problems:    COVID-19 virus infection    ESRD (end stage renal disease) (HCC)    Acute on chronic respiratory failure with hypoxia (HCC)    Type 2 diabetes mellitus, with long-term current use of insulin (HCC)    Acute on chronic diastolic heart failure (HCC)    Chronic obstructive pulmonary disease with acute lower respiratory infection (HCC)    Tobacco use    COPD exacerbation (HCC)                       Assessment and Plan     Ms. Humayra Huitron is a 64 y/o female admitted from [facility] for Acute on Chronic Hypoxic Respiratory Failure requiring NIPPV. 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     NEURO:  No Acute Issues   -GCS: 15  Plan:  -will continue serial neuro exams.     PULM  Acute on Chronic Hypoxic Respiratory Failure  COPD Hx of SARS-CoV-2 w recent DC from [facility] on 09/22/19  Current Every Day Smoker   -PTA: albuterol HFA, Advair b.i.d, duo nebs, Ellipta 1 puff daily   -Pre-hospital SPO2: 75%->started on NIPPV in ED   -Baseline Oxygen Requirement: 3-4L NC   -OSH CXr->RLL opacities, Bronchitis with small R pleural effusion   -OSH ABG: 7.42/44/75/28.4  -8/19 CXr->Pending  -Tx at OSH/EMS: Breathing Tx per EMS, Pip-Tazo   -8/19 ABG: Pending  Plan:   -Duonebs q4H and PRN   -methylprednisone / Critical Care COPD exacerbation Protocol / taper   -continue ABx / follow up ID work up  -Continue NIPPV, wean as able     CV:  -OSH TNI: .017  -BNP: Pending   -LA: Pending   -TNI: Pending   -Echo: Pending   EKG: Pending     CAD c/b NSTEMI  Elevated TNI   HF?  PVD  -PTA: clopidogrel 75mg  QD, simvastatin 20mg  qHS, ASA 81mg  QD, pentoxifylline 40mg  QD, torsemide 40mg  QD, isosorbide mononitrate 30mg  QD    Plan:  -will continue PTA clopidogrel, ASA, isosorbide for now, will restart remainder of PTA medications as able.   -will continue to follow serial TNI, Check BNP and follow up Echocardiogram   -will consider HD for volume management / Diuretics PRN as Ms Naramore reports she is able to make urine     HTN/HLD  Hypertensive Emergency  -PTA: metoprolol 25mg  b.i.d., nifedipine 60mg  QD   -SBP in 200's on Arrival at OSH   -ED Tx: Labetalol 10mg     Plan:  -will continue metoprolol, nifedipine when able   -PRN labetalol for SBP greater than     GI  GERD  -PTA metoclopramide 10mg  q6H PRN, pantoprazole 40mg  QD  -NPO for now   Plan:   -will continue pantoprazole.   -will advance diet as able.     RENAL  ESRD on HD (T,TH,S)   -  Dialysis at [facility] in [place]   -OSH BUN / Cr. 28 / 4.26  -I&O's:   Plan:  -will consult Nephrology / HD for volume management     ENDO:  DM Type II   -PTA Aspart 10units q AC, Levemir 10 units q HS  -POC Glucose: Pending  Plan:  -will follow serial labs. Utilize [facility] SSI, adjust as able.     ID:  Recent SARS-CoV-2 PNA  Concern for PNA  -SARS CoV-2 Dx 8/2 / Self reports test was false positive   -OSH Tx: Piptazo   -WBC: Pending   T-max: 36.3  -8/19 BC x2, Procal, Strep pneumo, legionella, RVP, Sputum Cx, UA w/ reflex Cx pending  -ABx:      Pip-tazo     Vancomycin      Doxycycline   Plan:  -will follow up ID work up  -continue ABx, wean as able         ZOX:WRUEA PRN   Diet:NPO     Prophylaxis Review:  Lines:PIV     Urinary Catheter:  None   VTE VWU:[id number]   GI ppx:  Pantoprazole   Insulin:Yes   PT/OT:Yes     Code status:Full code     Disposition:Continue Care in MICU       99291 x 1, 99292 x 1 - Pt critically ill with the above diagnosis and is at risk for life threatening deterioration and requires the provision of ICU level care. I spent 90 minutes providing critical care services including:  ? reviewing outside records and obtaining history from the patient/family members  ? performing a physical examination  ? serially reviewing laboratory, telemetry, hemodynamic, oximetry, and respiratory data  ? reviewing radiographic images  ? reviewing medications  ? managing fluids/electrolytes, antibiotics, ICU prophylaxis, and   ? developing the overall plan of care.      Jacinto Reap, APRN-NP  On Voalte       ___________________________________________________________________________  Primary Care Physician: Nathaneil Canary      CHIEF COMPLAINT: SOB     HISTORY OF PRESENT ILLNESS: Cynthia Stanley is a 64 y.o. female admitted from [place] for Acute on Chronic Hypoxic Respiratory Failure requiring NIPPV. 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 She reports feeling ill intermittently since the first part of Aug. Her symptoms began yesterday evening after eating. She got up to use the bathroom and upon returning,she began feeling more short of breath. EMS was contacted.       PMH:  Medical History:   Diagnosis Date   ? Arthritis    ? Congestive heart failure (CHF) (HCC)    ? COPD (chronic obstructive pulmonary disease) (HCC)    ? DM (diabetes mellitus) (HCC)    ? ESRD (end stage renal disease) (HCC)    ? Hypertension         PSH:  Surgical History:   Procedure Laterality Date   ? HX CESAREAN SECTION     ? HX TUBAL LIGATION          SOCIAL HISTORY:  Social History     Socioeconomic History   ? Marital status: Divorced     Spouse name: Not on file   ? Number of children: Not on file   ? Years of education: Not on file   ? Highest education  level: Not on file   Occupational History   ? Not on file   Tobacco Use   ? Smoking status: Current Every Day Smoker     Packs/day: 1.00     Types: Cigarettes   ? Smokeless tobacco: Never Used   Substance and Sexual Activity   ? Alcohol use: No   ? Drug use: No   ? Sexual activity: Never     Partners: Male   Other Topics Concern   ? Not on file   Social History Narrative   ? Not on file        FAMILY HISTORY:  No family history on file.     IMMUNIZATIONS:   There is no immunization history on file for this patient.        ALLERGIES:  Patient has no known allergies.    HOME MEDICATIONS:  Medications Prior to Admission   Medication Sig   ? acetaminophen (TYLENOL EXTRA STRENGTH) 500 mg tablet Take one tablet by mouth every 4 hours as needed. Max of 4,000 mg of acetaminophen in 24 hours.   ? aspirin EC 81 mg tablet Take 81 mg by mouth daily.   ? calcium acetate(phosphat bind) (PHOSLO) 667 mg tablet TAKE ONE TABLET BY MOUTH THREE TIMES DAILY WITH MEALS   ? carvediloL (COREG) 6.25 mg tablet Take 6.25 mg by mouth twice daily with meals.   ? clopiDOGrel (PLAVIX) 75 mg tablet Take 75 mg by mouth daily.   ? dicyclomine (BENTYL) 20 mg tablet Take 20 mg by mouth twice daily.   ? doxycycline hyclate (VIBRAMYCIN) 100 mg capsule TAKE ONE CAPSULE BY MOUTH TWICE DAILY FOR 5 DAYS   ? fluticasone-salmeterol (ADVAIR DISKUS) 250-50 mcg/dose inhalation disk Inhale 1 puff by mouth into the lungs every 12 hours.   ? insulin aspart (U-100) (NOVOLOG FLEXPEN U-100 INSULIN) 100 unit/mL (3 mL) injection PEN Inject 20 Units under the skin. Patient injects different than prescribed 5 units three times a day with meals   ? insulin detemir(+) (LEVEMIR) 100 unit/mL Inject 45 Units under the skin at bedtime daily.   ? isosorbide mononitrate ER (IMDUR) 60 mg tablet Take 60 mg by mouth twice daily.   ? metoprolol tartrate 25 mg tablet TAKE ONE TABLET BY MOUTH TWICE DAILY WITH FOOD   ? nicotine (NICODERM CQ STEP 1) 21 mg/day patch Apply one patch to top of skin as directed daily. Rotate patch location.  Indications: stop smoking   ? nicotine polacrilex (NICORETTE) 2 mg gum Take one each by mouth every 1 hour as needed. Chew to soften and park in mouth between lip and gum. May use 1 piece per hour, not to exceed 24 per day, for 12 weeks. May be used for longer, if needed.   ? NIFEdipine XL (PROCARDIA-XL) 60 mg tablet Take 60 mg by mouth daily.   ? pantoprazole DR (PROTONIX) 40 mg tablet Take 40 mg by mouth daily.   ? pentoxifylline ER (TRENTAL) 400 mg tablet Take 400 mg by mouth twice daily. Take with food.   ? rosuvastatin (CRESTOR) 20 mg tablet Take 20 mg by mouth at bedtime daily.   ? simvastatin (ZOCOR) 20 mg tablet Take 20 mg by mouth at bedtime daily.   ? torsemide (DEMADEX) 20 mg tablet Take 40 mg by mouth three times weekly. Monday, Wednesday and Friday.   ? umeclidinium(+) (INCRUSE ELLIPTA) 62.5 mcg/actuation inhalation disk Inhale 1 puff by mouth into the lungs daily.  Vitals:    10/08/19 0538   BP:    Pulse: 73   Temp:    SpO2: 100%       ROS:    A comprehensive review of systems was negative except for: Constitutional: positive for fatigue, malaise and anorexia  Respiratory: positive for increased work of breathing, wheezing or dyspnea on exertion    Physical Exam:    General:  Alert, chronically ill appearing. Mild to moderate distress   Head:  Normocephalic, without obvious abnormality, atraumatic  Eyes:  Conjunctivae, cornea clear, PERL   Neck:    symmetrical, No JVD, Trachea midline   Lungs:  Diminished all fields, faint expiratory wheezes noted to posterior bases   Chest wall:  No tenderness or deformity.  Heart:   Regular rate and rhythm, S1, S2 normal, no murmur, click rub or gallop  Abdomen:  Soft, non-tender.  Bowel sounds normal.  No masses.  No organomegaly.  Extremities: Extremities normal, atraumatic, no cyanosis or edema  Peripheral pulses   2+ and symmetric, all extremities  Cap Refill:  Less than 3 sec.   Neurologic:   CNII - XII intact.  Normal strength, sensation and reflexes throughout.      Artificial Airway   None       Ventilator/Respiratory Therapy  Yes:       Vent Weaning   NIPPV         Laboratory:    No results for input(s): NA, K, CL, CO2, GAP, BUN, CR, GLU, CA, ALBUMIN, MG, PO4, HGBA1C, TSH, CORT in the last 72 hours.    No results for input(s): WBC, HGB, HCT, PLTCT, PT, INR, PTT, AST, ALT, ALKPHOS, CKMB, MYOGLB, TNI in the last 72 hours.   Estimated Creatinine Clearance: 16.4 mL/min (A) (based on SCr of 4.19 mg/dL (H)).  Vitals:    10/08/19 0504   Weight: 94.3 kg (207 lb 14.3 oz)      Recent Labs     10/08/19  0516   PHART 7.46*   PO2ART 165*                 Radiology and Other Diagnostic Procedures Review:    Reviewed

## 2019-10-08 NOTE — Procedures
HEMODIALYSIS    PT ID CHECKED?Yes         Hemodialysis Procedure Report    Report recieved from Primary Care RN, Rachael  at 1230.  Hemodialysis treatment performed Bedside in room CA 5112.Marland Kitchen  Patient ID verified and consent signed: Yes   Labs and orders reviewed: Yes  Patient attached to Cardiac, NBP, SpO2 and CritLine monitoring.    AVG on left or right: Left side.  Needles inserted on first attempt without difficulty.  Secured with paper tape and dressed with band aid..     TX START TIME:  1245.  Prescribed treatment parameters achieved at treatment start.  Patient's face uncovered and in view: Yes   Lines and access secure, in view and intact:  Yes  Dr. Doy Hutching notified at 1325  that patient on treatment and estimated time off.   Staffing is setting up for dialysis bedside. Christin Bach was signed. Pre labs were drawn prior to treatment. Dialysis began with no issues.  1549 patient had moderate cramping and required bolus of 200 ml normal saline. Placed in minimum at this time. She has mild cramps that come and go.   1330 Drew up labs for lactic acid and troponin.  TX END TIME: 1620. Net UF: 3.2L. End Weight: 91.3 kg.   Needles pulled one at a time. Hemostasis achieved in less than 10 minutes on both A and V cannulation sites., Dressed with bandaid. Reinforced with gauze and paper tape.  and Dressed with gauze and paper tape.     Patient tolerated rest of dialysis with no more cramping.  Report given to Primary Care RN, Racheal at 1645.   See Hemodialysis Flow Sheet and MAR for details.

## 2019-10-08 NOTE — Progress Notes
Pharmacy Vancomycin Note  Subjective:   Cynthia Stanley is a 64 y.o. female being treated for lower respiratory infection.    Objective:     Current Vancomycin Orders   Medication Dose Route Frequency   ? vancomycin (VANCOCIN) 2,000 mg in sodium chloride 0.9% (NS) 290 mL IVPB  20 mg/kg Intravenous ONCE   ? vancomycin, pharmacy to manage  1 each Service Per Pharmacy   ? vancomycin, random dosing  1 each Intravenous Random Dosing     Start Date of  vancomycin therapy: 10/08/2019  Additional Abx: Doxycycline and zosyn  Actual Weight:  94.3 kg (207 lb 14.3 oz)  Dosing BW:  94.3 kg     Assessment:   Target levels for this patient: AUC (mcg*h/mL):  400-600      Plan:   1. Loading dose of 20 mg/kg followed by random dosing around HD  2. Next scheduled level(s): To be determined  3. Pharmacy will continue to monitor and adjust therapy as needed.    Marijean Heath, [facility]  10/08/2019

## 2019-10-09 LAB — POC GLUCOSE: Lab: 235 mg/dL — ABNORMAL HIGH (ref 70–100)

## 2019-10-09 NOTE — Progress Notes
RT Adult Assessment Note    NAME:Cynthia Stanley             MRN: [id number]             DOB:29-May-1955          AGE: 64 y.o.  ADMISSION DATE: 10/08/2019             DAYS ADMITTED: LOS: 0 days    RT Treatment Plan:            Additional Comments:  Impressions of the patient: with nurse, no distress noted      Vital Signs:  Pulse: 83  RR: 21 PER MINUTE  SpO2: 99 %  O2 Device:    Liter Flow: 6 Lpm  O2%:    Breath Sounds:    Respiratory Effort:

## 2019-10-10 MED ADMIN — IRON SUCROSE 100 MG IRON/5 ML IV SOLN [80854]: 300 mg | INTRAVENOUS | @ 22:00:00 | Stop: 2019-10-13 | NDC 00517234001

## 2019-10-10 MED ADMIN — SODIUM CHLORIDE 0.9 % IV SOLP [27838]: 300 mL | INTRAVENOUS | @ 15:00:00 | Stop: 2019-10-10 | NDC 00338004904

## 2019-10-10 MED ADMIN — SODIUM CHLORIDE 0.9 % IV SOLP [27838]: 300 mg | INTRAVENOUS | @ 22:00:00 | Stop: 2019-10-13 | NDC 00338004938

## 2019-10-11 MED ADMIN — IRON SUCROSE 100 MG IRON/5 ML IV SOLN [80854]: 300 mg | INTRAVENOUS | @ 14:00:00 | Stop: 2019-10-13 | NDC 00517234001

## 2019-10-11 MED ADMIN — DEXTROSE 50 % IN WATER (D50W) IV SYRG [2365]: 50 mL | INTRAVENOUS | @ 13:00:00 | Stop: 2019-10-11 | NDC 00409751766

## 2019-10-11 MED ADMIN — CARVEDILOL 12.5 MG PO TAB [77424]: 12.5 mg | ORAL | @ 22:00:00 | NDC 00904630261

## 2019-10-11 MED ADMIN — SODIUM CHLORIDE 0.9 % IV SOLP [27838]: 300 mg | INTRAVENOUS | @ 14:00:00 | Stop: 2019-10-13 | NDC 00338004938

## 2019-10-11 MED ADMIN — CARVEDILOL 12.5 MG PO TAB [77424]: 12.5 mg | ORAL | @ 01:00:00 | NDC 00904630261

## 2019-10-12 MED ADMIN — CARVEDILOL 12.5 MG PO TAB [77424]: 12.5 mg | ORAL | @ 22:00:00 | NDC 00904630261

## 2019-10-12 MED ADMIN — NIFEDIPINE 30 MG PO TR24 [28643]: 90 mg | ORAL | @ 15:00:00 | NDC 68084059711

## 2019-10-12 MED ADMIN — CHOLECALCIFEROL (VITAMIN D3) 25 MCG (1,000 UNIT) PO TAB [130074]: 4000 [IU] | ORAL | @ 17:00:00 | NDC 20555003300

## 2019-10-12 MED ADMIN — IRON SUCROSE 100 MG IRON/5 ML IV SOLN [80854]: 300 mg | INTRAVENOUS | @ 15:00:00 | Stop: 2019-10-12 | NDC 00517234001

## 2019-10-12 MED ADMIN — SODIUM CHLORIDE 0.9 % IV SOLP [27838]: 300 mg | INTRAVENOUS | @ 15:00:00 | Stop: 2019-10-12 | NDC 00338004938

## 2019-10-12 MED ADMIN — SENNOSIDES 8.6 MG PO TAB [11349]: 1 | ORAL | @ 05:00:00 | NDC 49483008001

## 2019-10-12 MED ADMIN — CARVEDILOL 12.5 MG PO TAB [77424]: 12.5 mg | ORAL | @ 15:00:00 | NDC 00904630261

## 2019-10-13 MED ADMIN — CARVEDILOL 12.5 MG PO TAB [77424]: 12.5 mg | ORAL | @ 23:00:00 | NDC 00904630261

## 2019-10-13 MED ADMIN — NIFEDIPINE 30 MG PO TR24 [28643]: 90 mg | ORAL | @ 19:00:00 | NDC 68084059711

## 2019-10-13 MED ADMIN — CHOLECALCIFEROL (VITAMIN D3) 25 MCG (1,000 UNIT) PO TAB [130074]: 4000 [IU] | ORAL | @ 19:00:00 | NDC 20555003300

## 2019-10-13 MED ADMIN — CARVEDILOL 12.5 MG PO TAB [77424]: 12.5 mg | ORAL | @ 19:00:00 | NDC 00904630261

## 2019-10-13 MED ADMIN — SODIUM CHLORIDE 0.9 % IV SOLP [27838]: 300 mL | INTRAVENOUS | @ 15:00:00 | Stop: 2019-10-13 | NDC 00338004904

## 2019-10-14 MED ADMIN — CARVEDILOL 12.5 MG PO TAB [77424]: 12.5 mg | ORAL | @ 14:00:00 | Stop: 2019-10-14 | NDC 00904630261

## 2019-10-14 MED ADMIN — CHOLECALCIFEROL (VITAMIN D3) 25 MCG (1,000 UNIT) PO TAB [130074]: 4000 [IU] | ORAL | @ 13:00:00 | Stop: 2019-10-14 | NDC 20555003300

## 2019-10-14 MED ADMIN — NIFEDIPINE 90 MG PO TR24 [27660]: 90 mg | ORAL | @ 13:00:00 | Stop: 2019-10-14 | NDC 68084060311

## 2019-11-18 ENCOUNTER — Encounter: Admit: 2019-11-18 | Discharge: 2019-11-18 | Payer: MEDICARE | Primary: Family

## 2019-12-08 ENCOUNTER — Encounter: Admit: 2019-12-08 | Discharge: 2019-12-08 | Payer: MEDICARE | Primary: Family

## 2020-03-25 ENCOUNTER — Inpatient Hospital Stay: Admit: 2020-03-25 | Discharge: 2020-03-25 | Payer: MEDICARE | Primary: Family

## 2020-03-25 ENCOUNTER — Encounter: Admit: 2020-03-25 | Discharge: 2020-03-25 | Payer: MEDICARE | Primary: Family

## 2020-03-25 ENCOUNTER — Inpatient Hospital Stay: Admit: 2020-03-25 | Payer: MEDICARE

## 2020-03-25 DIAGNOSIS — I214 Non-ST elevation (NSTEMI) myocardial infarction: Secondary | ICD-10-CM

## 2020-03-25 LAB — LIPASE: Lab: 43 U/L (ref 11–82)

## 2020-03-25 LAB — CBC
Lab: 10 g/dL — ABNORMAL LOW (ref 12.0–15.0)
Lab: 22 pg — ABNORMAL LOW (ref 26–34)
Lab: 272 K/UL — ABNORMAL HIGH (ref 150–400)
Lab: 6.2 K/UL (ref 4.5–11.0)

## 2020-03-25 LAB — COMPREHENSIVE METABOLIC PANEL
Lab: 0.7 mg/dL (ref 0.3–1.2)
Lab: 138 MMOL/L — ABNORMAL LOW (ref 137–147)
Lab: 14 — ABNORMAL HIGH (ref 3–12)
Lab: 140 U/L — ABNORMAL HIGH (ref 25–110)
Lab: 25 MMOL/L (ref 21–30)
Lab: 3.7 g/dL (ref 3.5–5.0)
Lab: 41 mg/dL — ABNORMAL HIGH (ref 7–25)
Lab: 7 U/L (ref 7–40)
Lab: 7.1 g/dL (ref 6.0–8.0)
Lab: 8 U/L (ref 7–56)
Lab: 8.8 mg/dL (ref 8.5–10.6)
Lab: 9 mL/min — ABNORMAL LOW (ref >60)

## 2020-03-25 LAB — MAGNESIUM: Lab: 2.1 mg/dL — ABNORMAL LOW (ref 1.6–2.6)

## 2020-03-25 LAB — POC GLUCOSE: Lab: 192 mg/dL — ABNORMAL HIGH (ref 70–100)

## 2020-03-25 LAB — TROPONIN-I
Lab: 0 ng/mL (ref 0.0–0.05)
Lab: 0 ng/mL — ABNORMAL HIGH (ref 0.0–0.05)
Lab: 0 ng/mL (ref 0.0–0.05)

## 2020-03-25 LAB — D-DIMER
Lab: 207 ng{FEU}/mL — ABNORMAL HIGH (ref <500)
Lab: 189 ng{FEU}/mL — ABNORMAL HIGH (ref <500)

## 2020-03-25 LAB — PROTIME INR (PT): Lab: 13 s (ref 8.5–14.4)

## 2020-03-25 LAB — IRON + BINDING CAPACITY + %SAT+ FERRITIN
Lab: 13 % — ABNORMAL LOW (ref 28–42)
Lab: 216 ug/dL — ABNORMAL LOW (ref 270–380)
Lab: 29 ug/dL — ABNORMAL LOW (ref 50–160)
Lab: 290 ng/mL — ABNORMAL HIGH (ref 10–200)

## 2020-03-25 MED ORDER — CLOPIDOGREL 75 MG PO TAB
75 mg | Freq: Every day | ORAL | 0 refills | Status: AC
Start: 2020-03-25 — End: ?
  Administered 2020-03-26 – 2020-04-06 (×13): 75 mg via ORAL

## 2020-03-25 MED ORDER — INSULIN GLARGINE 100 UNIT/ML (3 ML) SC INJ PEN
8 [IU] | Freq: Every evening | SUBCUTANEOUS | 0 refills | Status: DC
Start: 2020-03-25 — End: 2020-03-26

## 2020-03-25 MED ORDER — ACETAMINOPHEN 500 MG PO TAB
500 mg | ORAL | 0 refills | Status: AC | PRN
Start: 2020-03-25 — End: ?
  Administered 2020-03-28 – 2020-03-29 (×3): 500 mg via ORAL

## 2020-03-25 MED ORDER — NIFEDIPINE 90 MG PO TR24
90 mg | Freq: Every day | ORAL | 0 refills | Status: AC
Start: 2020-03-25 — End: ?
  Administered 2020-03-26 – 2020-03-28 (×3): 90 mg via ORAL

## 2020-03-25 MED ORDER — ONDANSETRON HCL (PF) 4 MG/2 ML IJ SOLN
4 mg | INTRAVENOUS | 0 refills | Status: AC | PRN
Start: 2020-03-25 — End: ?
  Administered 2020-03-26 – 2020-04-06 (×13): 4 mg via INTRAVENOUS

## 2020-03-25 MED ORDER — FLUTICASONE PROPION-SALMETEROL 250-50 MCG/DOSE IN DSDV
1 | Freq: Two times a day (BID) | RESPIRATORY_TRACT | 0 refills | Status: AC
Start: 2020-03-25 — End: ?
  Administered 2020-03-26 – 2020-04-02 (×2): 1 via RESPIRATORY_TRACT

## 2020-03-25 MED ORDER — ROSUVASTATIN 20 MG PO TAB
20 mg | Freq: Every evening | ORAL | 0 refills | Status: AC
Start: 2020-03-25 — End: ?
  Administered 2020-03-26 – 2020-04-05 (×11): 20 mg via ORAL

## 2020-03-25 MED ORDER — ONDANSETRON HCL (PF) 4 MG/2 ML IJ SOLN
4 mg | Freq: Once | INTRAVENOUS | 0 refills | Status: CP
Start: 2020-03-25 — End: ?
  Administered 2020-03-25: 22:00:00 4 mg via INTRAVENOUS

## 2020-03-25 MED ORDER — TIOTROPIUM BROMIDE 2.5 MCG/ACTUATION IN MIST
2 | Freq: Every day | RESPIRATORY_TRACT | 0 refills | Status: AC
Start: 2020-03-25 — End: ?
  Administered 2020-03-26 – 2020-04-02 (×2): 2 via RESPIRATORY_TRACT

## 2020-03-25 MED ORDER — SENNOSIDES 8.6 MG PO TAB
1 | Freq: Two times a day (BID) | ORAL | 0 refills | Status: AC | PRN
Start: 2020-03-25 — End: ?

## 2020-03-25 MED ORDER — ISOSORBIDE MONONITRATE 60 MG PO TB24
60 mg | Freq: Two times a day (BID) | ORAL | 0 refills | Status: AC
Start: 2020-03-25 — End: ?
  Administered 2020-03-26 – 2020-04-01 (×14): 60 mg via ORAL

## 2020-03-25 MED ORDER — CARVEDILOL 6.25 MG PO TAB
6.25 mg | Freq: Two times a day (BID) | ORAL | 0 refills | Status: AC
Start: 2020-03-25 — End: ?
  Administered 2020-03-26 – 2020-03-31 (×11): 6.25 mg via ORAL

## 2020-03-25 MED ORDER — NITROGLYCERIN 0.4 MG SL SUBL
.4 mg | SUBLINGUAL | 0 refills | Status: AC | PRN
Start: 2020-03-25 — End: ?

## 2020-03-25 MED ORDER — NIFEDIPINE 90 MG PO TR24
90 mg | Freq: Every day | ORAL | 0 refills | Status: DC
Start: 2020-03-25 — End: 2020-03-26

## 2020-03-25 MED ORDER — PENTOXIFYLLINE 400 MG PO TBER
400 mg | Freq: Two times a day (BID) | ORAL | 0 refills | Status: AC
Start: 2020-03-25 — End: ?
  Administered 2020-03-26 – 2020-04-06 (×23): 400 mg via ORAL

## 2020-03-25 MED ORDER — INSULIN GLARGINE 100 UNIT/ML (3 ML) SC INJ PEN
4 [IU] | Freq: Every evening | SUBCUTANEOUS | 0 refills | Status: AC
Start: 2020-03-25 — End: ?
  Administered 2020-03-26: 04:00:00 4 [IU] via SUBCUTANEOUS

## 2020-03-25 MED ORDER — CALCIUM ACETATE(PHOSPHAT BIND) 667 MG PO CAP
667 mg | Freq: Three times a day (TID) | ORAL | 0 refills | Status: AC
Start: 2020-03-25 — End: ?
  Administered 2020-03-26 – 2020-04-06 (×26): 667 mg via ORAL

## 2020-03-25 MED ORDER — ASPIRIN 81 MG PO TBEC
81 mg | Freq: Every day | ORAL | 0 refills | Status: AC
Start: 2020-03-25 — End: ?
  Administered 2020-03-26 – 2020-04-06 (×12): 81 mg via ORAL

## 2020-03-25 MED ORDER — INSULIN ASPART 100 UNIT/ML SC FLEXPEN
5 [IU] | Freq: Three times a day (TID) | SUBCUTANEOUS | 0 refills | Status: AC
Start: 2020-03-25 — End: ?

## 2020-03-25 MED ORDER — TORSEMIDE 20 MG PO TAB
40 mg | ORAL | 0 refills | Status: AC
Start: 2020-03-25 — End: ?
  Administered 2020-03-28 – 2020-04-06 (×5): 40 mg via ORAL

## 2020-03-25 MED ORDER — HEPARIN, PORCINE (PF) 5,000 UNIT/0.5 ML IJ SYRG
5000 [IU] | SUBCUTANEOUS | 0 refills | Status: AC
Start: 2020-03-25 — End: ?
  Administered 2020-03-26 – 2020-04-06 (×33): 5000 [IU] via SUBCUTANEOUS

## 2020-03-25 MED ORDER — INSULIN ASPART 100 UNIT/ML SC FLEXPEN
0-6 [IU] | Freq: Before meals | SUBCUTANEOUS | 0 refills | Status: AC
Start: 2020-03-25 — End: ?
  Administered 2020-04-01: 18:00:00 2 [IU] via SUBCUTANEOUS

## 2020-03-25 MED ORDER — PANTOPRAZOLE 40 MG PO TBEC
40 mg | Freq: Every day | ORAL | 0 refills | Status: AC
Start: 2020-03-25 — End: ?
  Administered 2020-03-26 – 2020-04-06 (×13): 40 mg via ORAL

## 2020-03-25 NOTE — Progress Notes
65 yr old F  Chest Pain began at 0530  Non compliant DM, CHF, COPD, ESRD on dialysis  Responded to NTG, Morphine  EKG no changes  Hospitalist won't admit her due to dialysis.    [facility] at capacity.    Imaging: CXR pleural effusion, vascular congestion     Labs: TNI 0.034  Cr 4.96 K 4.4 Cl 100 Co2 26.0 Dex 127  WBC 8.3  Hgb 9.6  INR 1.2  COVID Pending, no known history    Meds:NTG x 3 Morphine 2 mg ASA EMS    Vitals: 85  2L NC always 96% 118/54  16 afebrile    Procedure:    Hx:Non compliant DM, CHF, COPD, ESRD on dialysis    Reason for transfer: Capacity

## 2020-03-26 ENCOUNTER — Inpatient Hospital Stay: Admit: 2020-03-26 | Discharge: 2020-03-26 | Payer: MEDICARE | Primary: Family

## 2020-03-26 MED ADMIN — RP DX TL-201 THALLOUS CHL MCI [210517]: 1.64 | INTRAVENOUS | @ 16:00:00 | Stop: 2020-03-26 | NDC 54029258509

## 2020-03-26 MED ADMIN — SODIUM CHLORIDE 0.9 % IV SOLP [27838]: 500 mL | INTRAVENOUS | @ 21:00:00 | Stop: 2020-03-26 | NDC 00338004904

## 2020-03-26 MED ADMIN — REGADENOSON 0.4 MG/5 ML IV SYRG [168865]: 0.4 mg | INTRAVENOUS | @ 15:00:00 | Stop: 2020-03-26 | NDC 00469650189

## 2020-03-26 MED ADMIN — AMINOPHYLLINE 500 MG/20 ML IV SOLN [88541]: 50 mg | INTRAVENOUS | @ 15:00:00 | Stop: 2020-03-27 | NDC 00517382025

## 2020-03-26 MED ADMIN — PERFLUTREN LIPID MICROSPHERES 1.1 MG/ML IV SUSP [79178]: 2 mL | INTRAVENOUS | @ 14:00:00 | Stop: 2020-03-26 | NDC 11994001116

## 2020-03-26 MED ADMIN — RP DX TL-201 THALLOUS CHL MCI [210517]: 0.55 | INTRAVENOUS | @ 19:00:00 | Stop: 2020-03-26 | NDC 54029258509

## 2020-03-28 ENCOUNTER — Inpatient Hospital Stay: Admit: 2020-03-28 | Discharge: 2020-03-28 | Payer: MEDICARE | Primary: Family

## 2020-03-28 ENCOUNTER — Encounter: Admit: 2020-03-28 | Discharge: 2020-03-28 | Payer: MEDICARE | Primary: Family

## 2020-03-28 DIAGNOSIS — I70201 Unspecified atherosclerosis of native arteries of extremities, right leg: Secondary | ICD-10-CM

## 2020-03-28 DIAGNOSIS — I739 Peripheral vascular disease, unspecified: Secondary | ICD-10-CM

## 2020-03-28 MED ADMIN — RP DX TC-99M MAA MCI [210505]: 5.2 | INTRAVENOUS | @ 14:00:00 | Stop: 2020-03-28 | NDC 54029257209

## 2020-03-28 MED ADMIN — RANOLAZINE 500 MG PO TB12 [95539]: 500 mg | ORAL | @ 18:00:00 | NDC 70756070360

## 2020-03-28 MED ADMIN — EPOETIN ALFA-EPBX 10,000 UNIT/ML IJ SOLN [336732]: 10000 [IU] | SUBCUTANEOUS | @ 23:00:00 | Stop: 2020-03-28 | NDC 00069130801

## 2020-03-28 MED ADMIN — DIPHENHYDRAMINE HCL 25 MG PO CAP [2509]: 25 mg | ORAL | @ 06:00:00 | NDC 00904530661

## 2020-03-29 MED ADMIN — SODIUM CHLORIDE 0.9 % IV SOLP [27838]: 300 mL | INTRAVENOUS | @ 13:00:00 | Stop: 2020-03-29 | NDC 00338004904

## 2020-03-29 MED ADMIN — RANOLAZINE 500 MG PO TB12 [95539]: 500 mg | ORAL | @ 03:00:00 | NDC 70756070360

## 2020-03-29 MED ADMIN — IRON SUCROSE 100 MG IRON/5 ML IV SOLN [80854]: 300 mg | INTRAVENOUS | @ 03:00:00 | Stop: 2020-03-31 | NDC 00517234001

## 2020-03-29 MED ADMIN — IRON SUCROSE 100 MG IRON/5 ML IV SOLN [80854]: 300 mg | INTRAVENOUS | @ 22:00:00 | Stop: 2020-03-31 | NDC 00517234001

## 2020-03-29 MED ADMIN — SODIUM CHLORIDE 0.9 % IV SOLP [27838]: 300 mg | INTRAVENOUS | @ 03:00:00 | Stop: 2020-03-31 | NDC 00338004902

## 2020-03-29 MED ADMIN — SODIUM CHLORIDE 0.9 % IV SOLP [27838]: 300 mg | INTRAVENOUS | @ 22:00:00 | Stop: 2020-03-31 | NDC 00338004902

## 2020-03-30 MED ADMIN — EPOETIN ALFA-EPBX 10,000 UNIT/ML IJ SOLN [336732]: 10000 [IU] | SUBCUTANEOUS | @ 05:00:00 | NDC 00069130801

## 2020-03-30 MED ADMIN — RANOLAZINE 500 MG PO TB12 [95539]: 500 mg | ORAL | @ 15:00:00 | NDC 70756070360

## 2020-03-30 MED ADMIN — SODIUM CHLORIDE 0.9 % IV SOLP [27838]: 300 mg | INTRAVENOUS | @ 23:00:00 | Stop: 2020-03-31 | NDC 00338004902

## 2020-03-30 MED ADMIN — IRON SUCROSE 100 MG IRON/5 ML IV SOLN [80854]: 300 mg | INTRAVENOUS | @ 23:00:00 | Stop: 2020-03-31 | NDC 00517234001

## 2020-03-31 MED ADMIN — POLYETHYLENE GLYCOL 3350 17 GRAM PO PWPK [25424]: 17 g | ORAL | @ 17:00:00 | NDC 00904693186

## 2020-03-31 MED ADMIN — CARVEDILOL 6.25 MG PO TAB [77309]: 6.25 mg | ORAL | @ 17:00:00 | Stop: 2020-03-31 | NDC 00904630161

## 2020-03-31 MED ADMIN — SENNOSIDES-DOCUSATE SODIUM 8.6-50 MG PO TAB [40926]: 1 | ORAL | @ 17:00:00 | NDC 60687062211

## 2020-03-31 MED ADMIN — RANOLAZINE 500 MG PO TB12 [95539]: 500 mg | ORAL | @ 15:00:00 | NDC 70756070360

## 2020-04-01 MED ADMIN — CARVEDILOL 12.5 MG PO TAB [77424]: 12.5 mg | ORAL | @ 03:00:00 | NDC 00904630261

## 2020-04-01 MED ADMIN — CARVEDILOL 12.5 MG PO TAB [77424]: 12.5 mg | ORAL | @ 14:00:00 | NDC 00904630261

## 2020-04-01 MED ADMIN — RANOLAZINE 500 MG PO TB12 [95539]: 500 mg | ORAL | @ 14:00:00 | NDC 70756070360

## 2020-04-01 MED ADMIN — SENNOSIDES-DOCUSATE SODIUM 8.6-50 MG PO TAB [40926]: 1 | ORAL | @ 03:00:00 | NDC 60687062211

## 2020-04-01 MED ADMIN — CARVEDILOL 12.5 MG PO TAB [77424]: 12.5 mg | ORAL | @ 23:00:00 | NDC 00904630261

## 2020-04-01 MED ADMIN — POLYETHYLENE GLYCOL 3350 17 GRAM PO PWPK [25424]: 17 g | ORAL | @ 14:00:00 | NDC 00904693186

## 2020-04-01 MED ADMIN — AMLODIPINE 5 MG PO TAB [79041]: 5 mg | ORAL | @ 23:00:00 | NDC 00904637061

## 2020-04-01 MED ADMIN — SODIUM CHLORIDE 0.9 % IV SOLP [27838]: 300 mL | INTRAVENOUS | Stop: 2020-04-01 | NDC 00338004904

## 2020-04-01 MED ADMIN — SENNOSIDES-DOCUSATE SODIUM 8.6-50 MG PO TAB [40926]: 1 | ORAL | @ 14:00:00 | NDC 60687062211

## 2020-04-02 MED ADMIN — CARVEDILOL 12.5 MG PO TAB [77424]: 12.5 mg | ORAL | @ 15:00:00 | NDC 00904630261

## 2020-04-02 MED ADMIN — SODIUM CHLORIDE 0.9 % IV SOLP [27838]: 300 mL | INTRAVENOUS | Stop: 2020-04-02 | NDC 00338004904

## 2020-04-02 MED ADMIN — SENNOSIDES-DOCUSATE SODIUM 8.6-50 MG PO TAB [40926]: 1 | ORAL | @ 03:00:00 | NDC 60687062211

## 2020-04-02 MED ADMIN — ISOSORBIDE MONONITRATE 60 MG PO TB24 [24268]: 120 mg | ORAL | @ 15:00:00 | NDC 00904645061

## 2020-04-02 MED ADMIN — ISOSORBIDE MONONITRATE 60 MG PO TB24 [24268]: 60 mg | ORAL | @ 03:00:00 | Stop: 2020-04-02 | NDC 00904645061

## 2020-04-02 MED ADMIN — POLYETHYLENE GLYCOL 3350 17 GRAM PO PWPK [25424]: 17 g | ORAL | @ 15:00:00 | NDC 00904693186

## 2020-04-02 MED ADMIN — AMLODIPINE 5 MG PO TAB [79041]: 5 mg | ORAL | @ 15:00:00 | NDC 00904637061

## 2020-04-02 MED ADMIN — RANOLAZINE 500 MG PO TB12 [95539]: 500 mg | ORAL | @ 15:00:00 | NDC 70756070360

## 2020-04-02 MED ADMIN — SENNOSIDES-DOCUSATE SODIUM 8.6-50 MG PO TAB [40926]: 1 | ORAL | @ 15:00:00 | NDC 60687062211

## 2020-04-03 MED ADMIN — SENNOSIDES-DOCUSATE SODIUM 8.6-50 MG PO TAB [40926]: 1 | ORAL | @ 15:00:00 | NDC 60687062211

## 2020-04-03 MED ADMIN — RANOLAZINE 500 MG PO TB12 [95539]: 500 mg | ORAL | @ 15:00:00 | NDC 70756070360

## 2020-04-03 MED ADMIN — CARVEDILOL 12.5 MG PO TAB [77424]: 12.5 mg | ORAL | @ 23:00:00 | NDC 00904630261

## 2020-04-03 MED ADMIN — AMLODIPINE 5 MG PO TAB [79041]: 5 mg | ORAL | @ 15:00:00 | NDC 00904637061

## 2020-04-03 MED ADMIN — POLYETHYLENE GLYCOL 3350 17 GRAM PO PWPK [25424]: 17 g | ORAL | @ 15:00:00 | NDC 00904693186

## 2020-04-03 MED ADMIN — EPOETIN ALFA-EPBX 10,000 UNIT/ML IJ SOLN [336732]: 10000 [IU] | SUBCUTANEOUS | @ 05:00:00 | NDC 00069130801

## 2020-04-03 MED ADMIN — SENNOSIDES-DOCUSATE SODIUM 8.6-50 MG PO TAB [40926]: 1 | ORAL | @ 03:00:00 | NDC 60687062211

## 2020-04-03 MED ADMIN — ISOSORBIDE MONONITRATE 60 MG PO TB24 [24268]: 120 mg | ORAL | @ 15:00:00 | NDC 00904645061

## 2020-04-03 MED ADMIN — CARVEDILOL 12.5 MG PO TAB [77424]: 12.5 mg | ORAL | @ 15:00:00 | NDC 00904630261

## 2020-04-04 MED ADMIN — POLYETHYLENE GLYCOL 3350 17 GRAM PO PWPK [25424]: 17 g | ORAL | @ 15:00:00 | Stop: 2020-04-04 | NDC 00904693186

## 2020-04-04 MED ADMIN — SENNOSIDES-DOCUSATE SODIUM 8.6-50 MG PO TAB [40926]: 1 | ORAL | @ 04:00:00 | NDC 60687062211

## 2020-04-04 MED ADMIN — ISOSORBIDE MONONITRATE 60 MG PO TB24 [24268]: 120 mg | ORAL | @ 15:00:00 | NDC 00904645061

## 2020-04-04 MED ADMIN — AMLODIPINE 5 MG PO TAB [79041]: 5 mg | ORAL | @ 15:00:00 | NDC 00904637061

## 2020-04-04 MED ADMIN — CARVEDILOL 12.5 MG PO TAB [77424]: 12.5 mg | ORAL | @ 15:00:00 | NDC 00904630261

## 2020-04-04 MED ADMIN — RANOLAZINE 500 MG PO TB12 [95539]: 500 mg | ORAL | @ 15:00:00 | NDC 70756070360

## 2020-04-04 MED ADMIN — SENNOSIDES-DOCUSATE SODIUM 8.6-50 MG PO TAB [40926]: 1 | ORAL | @ 15:00:00 | NDC 60687062211

## 2020-04-05 MED ADMIN — SODIUM CHLORIDE 0.9 % IV SOLP [27838]: 300 mL | INTRAVENOUS | @ 20:00:00 | NDC 00338952572

## 2020-04-05 MED ADMIN — SENNOSIDES-DOCUSATE SODIUM 8.6-50 MG PO TAB [40926]: 1 | ORAL | @ 15:00:00 | NDC 60687062211

## 2020-04-05 MED ADMIN — AMLODIPINE 5 MG PO TAB [79041]: 5 mg | ORAL | @ 15:00:00 | NDC 00904637061

## 2020-04-05 MED ADMIN — POLYETHYLENE GLYCOL 3350 17 GRAM PO PWPK [25424]: 34 g | ORAL | @ 04:00:00 | NDC 00904693186

## 2020-04-05 MED ADMIN — ISOSORBIDE MONONITRATE 60 MG PO TB24 [24268]: 120 mg | ORAL | @ 15:00:00 | NDC 00904645061

## 2020-04-05 MED ADMIN — CARVEDILOL 12.5 MG PO TAB [77424]: 12.5 mg | ORAL | @ 15:00:00 | NDC 00904630261

## 2020-04-05 MED ADMIN — POLYETHYLENE GLYCOL 3350 17 GRAM PO PWPK [25424]: 34 g | ORAL | @ 15:00:00 | NDC 00904693186

## 2020-04-05 MED ADMIN — EPOETIN ALFA-EPBX 10,000 UNIT/ML IJ SOLN [336732]: 10000 [IU] | SUBCUTANEOUS | @ 20:00:00 | Stop: 2020-04-05 | NDC 00069130801

## 2020-04-05 MED ADMIN — CARVEDILOL 12.5 MG PO TAB [77424]: 12.5 mg | ORAL | NDC 00904630261

## 2020-04-05 MED ADMIN — MAGNESIUM HYDROXIDE 2,400 MG/10 ML PO SUSP [136089]: 10 mL | ORAL | Stop: 2020-04-05 | NDC 00121052710

## 2020-04-05 MED ADMIN — SENNOSIDES-DOCUSATE SODIUM 8.6-50 MG PO TAB [40926]: 1 | ORAL | @ 04:00:00 | NDC 60687062211

## 2020-04-05 MED ADMIN — RANOLAZINE 500 MG PO TB12 [95539]: 500 mg | ORAL | @ 15:00:00 | NDC 70756070360

## 2020-04-06 MED ADMIN — SENNOSIDES-DOCUSATE SODIUM 8.6-50 MG PO TAB [40926]: 1 | ORAL | @ 15:00:00 | Stop: 2020-04-07 | NDC 60687062211

## 2020-04-06 MED ADMIN — POLYETHYLENE GLYCOL 3350 17 GRAM PO PWPK [25424]: 34 g | ORAL | @ 03:00:00 | NDC 00904693186

## 2020-04-06 MED ADMIN — EPOETIN ALFA-EPBX 10,000 UNIT/ML IJ SOLN [336732]: 10000 [IU] | SUBCUTANEOUS | @ 12:00:00 | Stop: 2020-04-07 | NDC 00069130801

## 2020-04-06 MED ADMIN — RANOLAZINE 500 MG PO TB12 [95539]: 500 mg | ORAL | @ 15:00:00 | Stop: 2020-04-07 | NDC 70756070360

## 2020-04-06 MED ADMIN — CARVEDILOL 12.5 MG PO TAB [77424]: 12.5 mg | ORAL | @ 15:00:00 | Stop: 2020-04-07 | NDC 00904630261

## 2020-04-06 MED ADMIN — AMLODIPINE 5 MG PO TAB [79041]: 5 mg | ORAL | @ 15:00:00 | Stop: 2020-04-07 | NDC 00904637061

## 2020-04-06 MED ADMIN — POLYETHYLENE GLYCOL 3350 17 GRAM PO PWPK [25424]: 34 g | ORAL | @ 15:00:00 | Stop: 2020-04-07 | NDC 00904693186

## 2020-04-06 MED ADMIN — ISOSORBIDE MONONITRATE 60 MG PO TB24 [24268]: 120 mg | ORAL | @ 15:00:00 | Stop: 2020-04-07 | NDC 00904645061

## 2020-06-20 ENCOUNTER — Encounter: Admit: 2020-06-20 | Discharge: 2020-06-20 | Payer: MEDICARE | Primary: Family

## 2020-06-20 NOTE — Telephone Encounter
Tried to reach out to patient to reschedule ultrasound due to sonographer being out. Main phone goes straight to VM, the other phone number the VM has not been set up yet. Patient is also un-enrolled in [employer].

## 2020-11-19 DEATH — deceased

## 2021-06-23 IMAGING — CT CHEST WO(Adult)
2 of 4 series · 13 of 36 positions shown, 16 images · non-contrast
Comparison: none

[Series 2: thorax ax 1.50 br40 s3 · axial · 0.63mm/px · z∈[+1661,+1958]mm · 10 of 234 slices shown, 13 images]
[im 18/234  mediastinal]
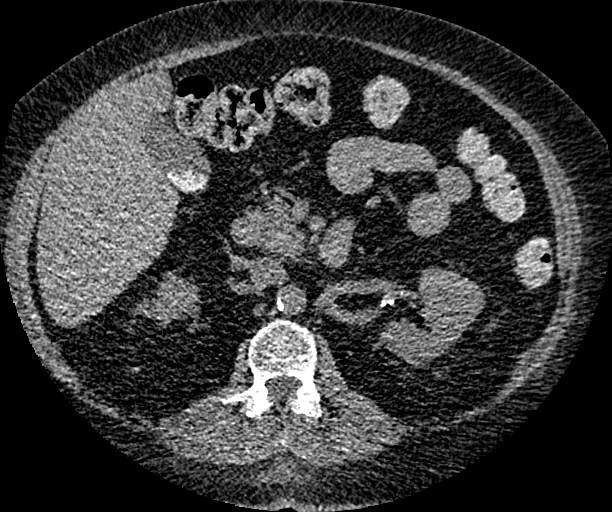
[im 18/234  lung]
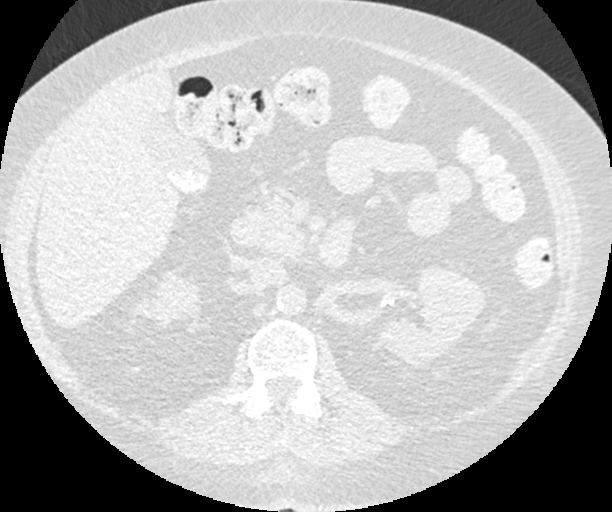
[im 36/234  lung]
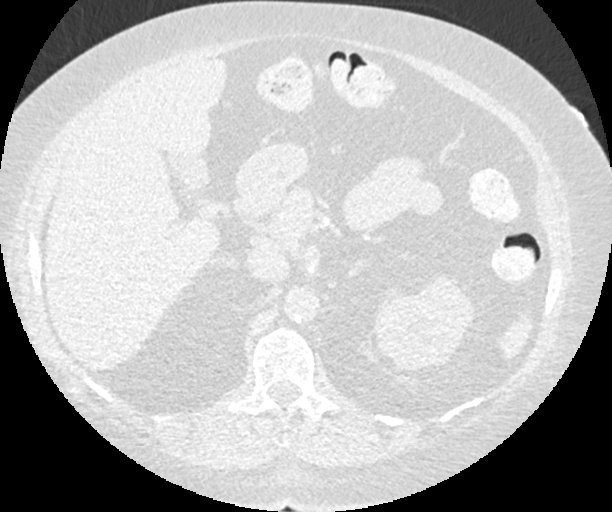
[im 72/234  lung]
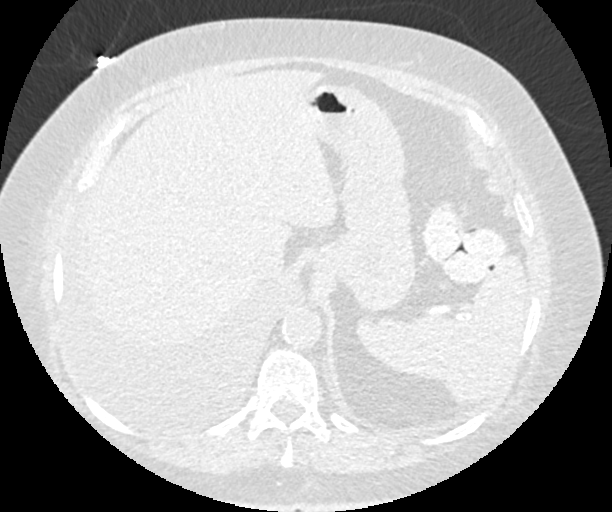
[im 90/234  lung]
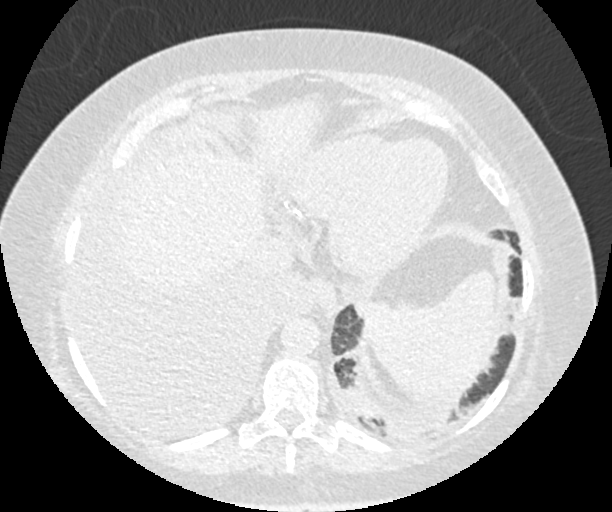
[im 108/234  mediastinal]
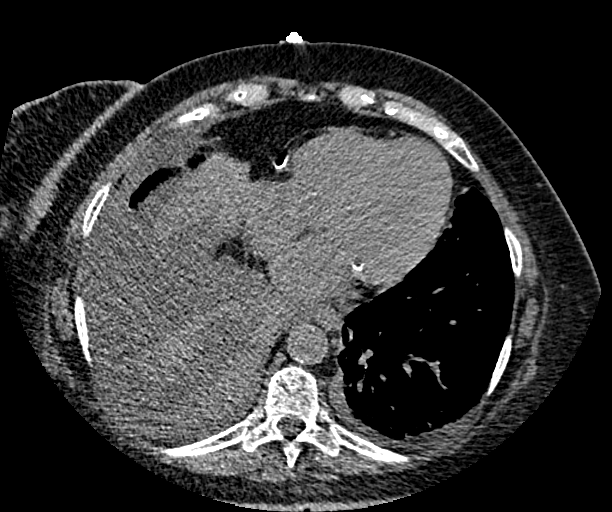
[im 108/234  lung]
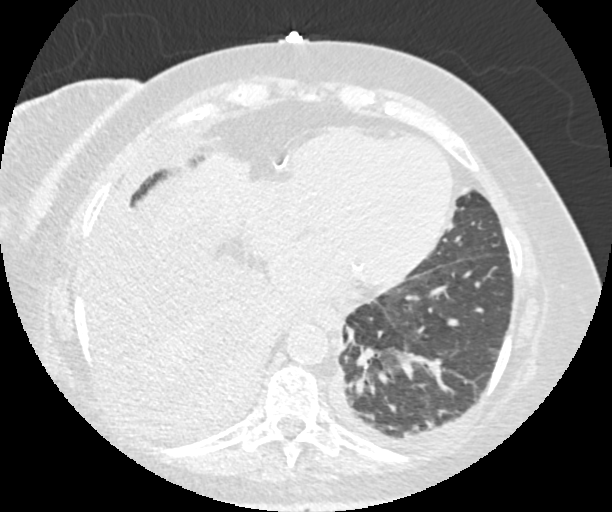
[im 126/234  lung]
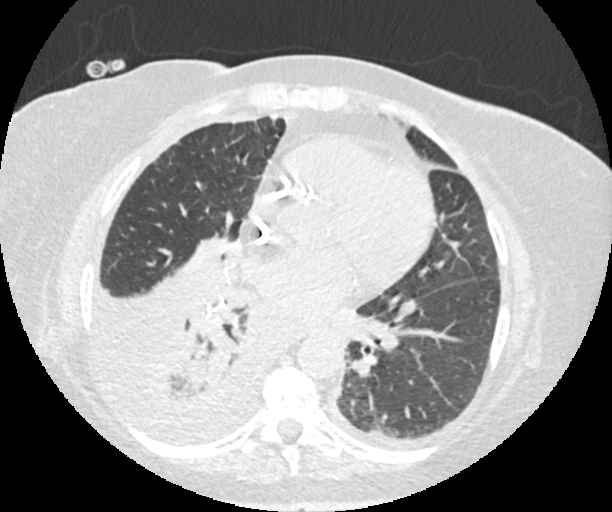
[im 144/234  lung]
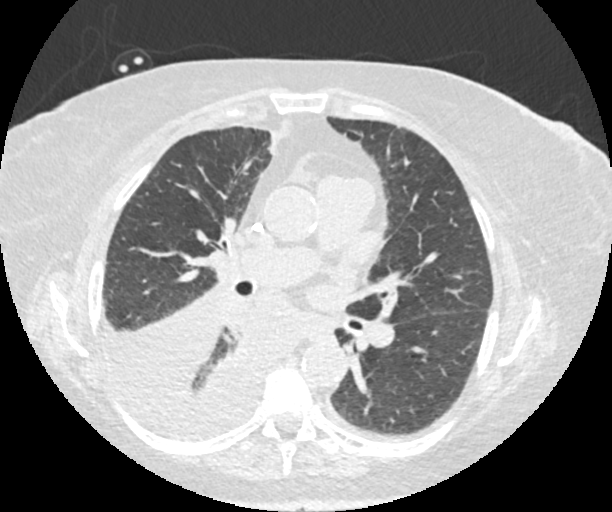
[im 180/234  lung]
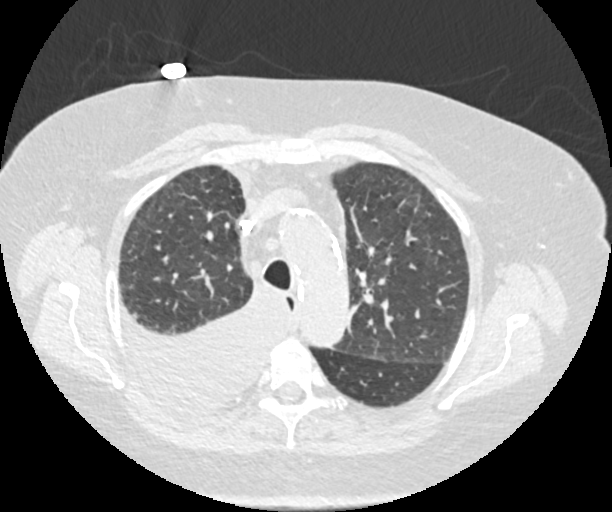
[im 198/234  mediastinal]
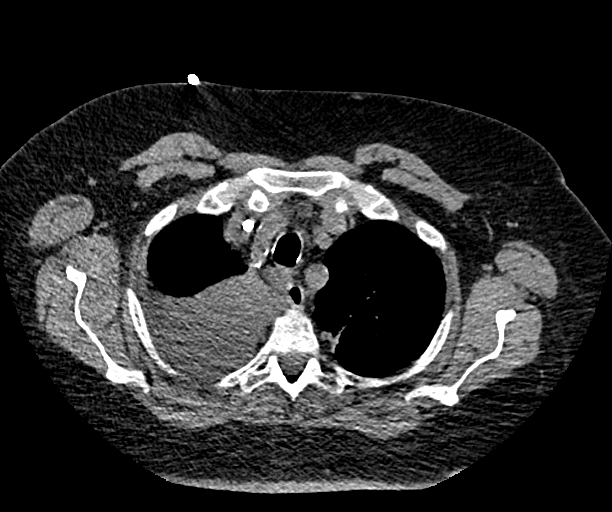
[im 198/234  lung]
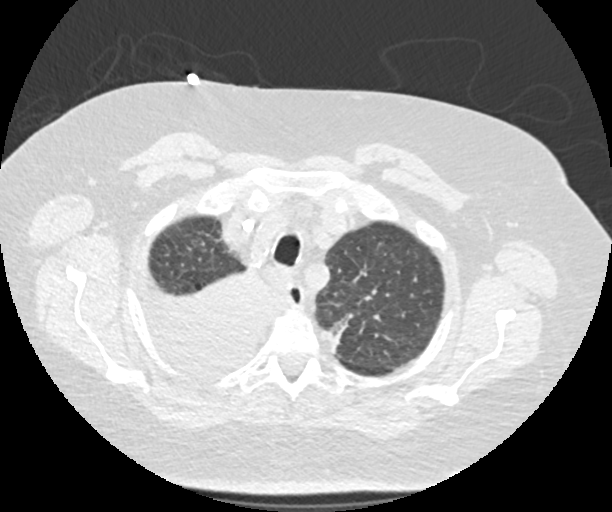
[im 216/234  lung]
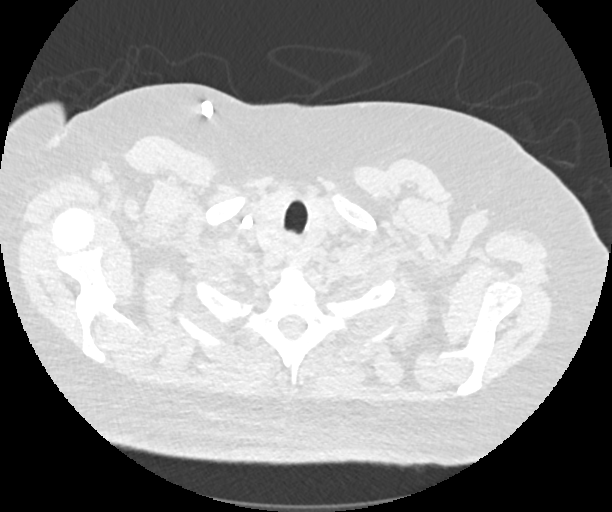

[Series 4: thorax cor 1.50 br40 s3 · coronal · 0.69mm/px · 3 of 216 slices shown]
[im 44/216  lung]
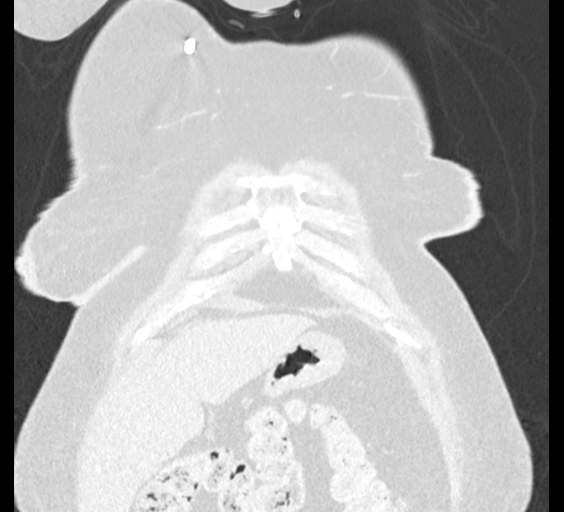
[im 87/216  lung]
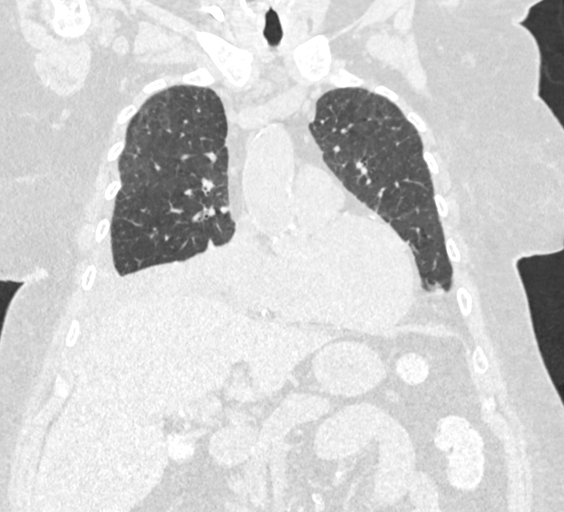
[im 130/216  lung]
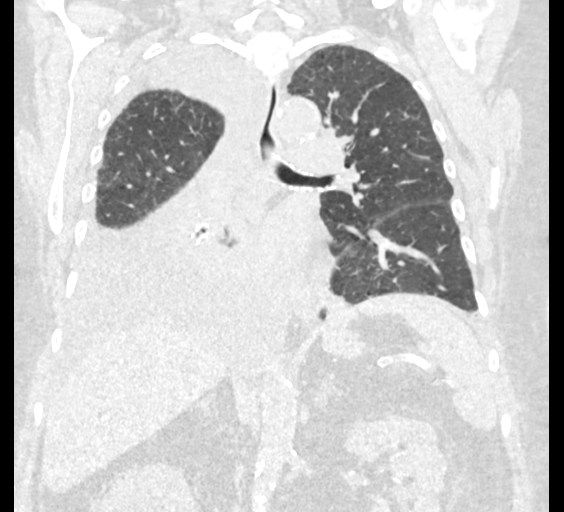

[13 of 36 positions shown; findings below may reference images not displayed]

DIAGNOSTIC STUDIES

EXAM

CT chest without contrast.

INDICATION

soa /chest pain/ query per CXR
PT STATES HAS INCREASED SOA. PT STATES IS ON DIALYSIS. CREAT 5. CT/NM 0/0. TJ/ME

TECHNIQUE

All CT scans at this facility use dose modulation, iterative reconstruction, and/or weight based
dosing when appropriate to reduce radiation dose to as low as reasonably achievable.

Number of previous computed tomography exams in the last 12 months is 1.

Number of previous nuclear medicine myocardial perfusion studies in the last 12 months is 0

COMPARISONS

July 06, 2016

FINDINGS

Scattered subcentimeter mediastinal lymph nodes are evident similar prior exam likely reactive.
Mild cardiomegaly is seen. There is dense calcification of the coronary vessels. Right-sided central
venous catheter is evident.

There is cholelithiasis. Calcification of the upper abdominal vasculature is evident. Remaining
visualized subdiaphragmatic structures are normal.

There are bilateral pleural effusions estimated to be moderate to large on the right and small on
the left. There is associated consolidation and atelectasis within the lung bases greater on the
right than left. Mild interstitial prominence is seen suggesting fluid overload. Irregular scarring
is identified in the medial left upper lobe. Bronchus intermedius is occluded just beyond its origin
resulting in extensive right middle lobe atelectasis. At least portions of the right lower lobe
bronchi are also occluded contributing to atelectasis and consolidation in the right lung base.

No concerning osseous lesions are seen.

IMPRESSION

Extensive atelectasis and consolidation right middle lobe and right lower lobe with moderate to
large right pleural effusion. This is progressed since prior chest x-ray. The bronchus intermedius
is occluded likely due to mucous plugging. Endobronchial lesion would be difficult to exclude.
Continued follow-up is recommended. Thoracentesis or bronchoscopy may be beneficial.

Interstitial prominence and small left pleural effusion suggesting fluid overload versus mild
congestive failure.

Cholelithiasis and calcification of coronary vessels and upper abdominal vasculature. Report called
to Dr. Sing at [DATE] a.m..

Tech Notes:

PT STATES HAS INCREASED SOA. PT STATES IS ON DIALYSIS. CREAT 5. CT/NM 0/0. TJ/ME
